# Patient Record
Sex: Female | Born: 1970 | Race: Black or African American | Hispanic: No | Marital: Single | Smoking: Never smoker
Health system: Southern US, Community
[De-identification: ages and names within clinical notes are randomized; demographics above are authoritative.]

## PROBLEM LIST (undated history)

## (undated) DIAGNOSIS — M2550 Pain in unspecified joint: Secondary | ICD-10-CM

## (undated) DIAGNOSIS — M549 Dorsalgia, unspecified: Secondary | ICD-10-CM

## (undated) DIAGNOSIS — K219 Gastro-esophageal reflux disease without esophagitis: Secondary | ICD-10-CM

## (undated) DIAGNOSIS — R002 Palpitations: Secondary | ICD-10-CM

## (undated) DIAGNOSIS — J45909 Unspecified asthma, uncomplicated: Secondary | ICD-10-CM

## (undated) DIAGNOSIS — R7303 Prediabetes: Secondary | ICD-10-CM

## (undated) DIAGNOSIS — D649 Anemia, unspecified: Secondary | ICD-10-CM

## (undated) DIAGNOSIS — Z981 Arthrodesis status: Secondary | ICD-10-CM

## (undated) DIAGNOSIS — F419 Anxiety disorder, unspecified: Secondary | ICD-10-CM

## (undated) DIAGNOSIS — E559 Vitamin D deficiency, unspecified: Secondary | ICD-10-CM

## (undated) HISTORY — DX: Palpitations: R00.2

## (undated) HISTORY — DX: Anxiety disorder, unspecified: F41.9

## (undated) HISTORY — PX: SPINAL FUSION: SHX223

## (undated) HISTORY — DX: Anemia, unspecified: D64.9

## (undated) HISTORY — DX: Pain in unspecified joint: M25.50

## (undated) HISTORY — PX: PARATHYROIDECTOMY: SHX19

## (undated) HISTORY — PX: ABDOMINAL HYSTERECTOMY: SHX81

## (undated) HISTORY — DX: Unspecified asthma, uncomplicated: J45.909

## (undated) HISTORY — DX: Prediabetes: R73.03

## (undated) HISTORY — DX: Vitamin D deficiency, unspecified: E55.9

## (undated) HISTORY — DX: Dorsalgia, unspecified: M54.9

---

## 1998-01-20 ENCOUNTER — Ambulatory Visit (HOSPITAL_COMMUNITY): Admission: RE | Admit: 1998-01-20 | Discharge: 1998-01-20 | Payer: Self-pay | Admitting: Obstetrics and Gynecology

## 1998-05-18 ENCOUNTER — Inpatient Hospital Stay (HOSPITAL_COMMUNITY): Admission: AD | Admit: 1998-05-18 | Discharge: 1998-05-18 | Payer: Self-pay | Admitting: Obstetrics and Gynecology

## 1998-05-29 ENCOUNTER — Inpatient Hospital Stay (HOSPITAL_COMMUNITY): Admission: AD | Admit: 1998-05-29 | Discharge: 1998-05-29 | Payer: Self-pay | Admitting: Obstetrics and Gynecology

## 1998-10-02 ENCOUNTER — Inpatient Hospital Stay (HOSPITAL_COMMUNITY): Admission: AD | Admit: 1998-10-02 | Discharge: 1998-10-02 | Payer: Self-pay | Admitting: Obstetrics and Gynecology

## 1998-11-11 ENCOUNTER — Inpatient Hospital Stay (HOSPITAL_COMMUNITY): Admission: AD | Admit: 1998-11-11 | Discharge: 1998-11-11 | Payer: Self-pay | Admitting: Obstetrics and Gynecology

## 1998-11-28 ENCOUNTER — Inpatient Hospital Stay (HOSPITAL_COMMUNITY): Admission: AD | Admit: 1998-11-28 | Discharge: 1998-11-28 | Payer: Self-pay | Admitting: Obstetrics and Gynecology

## 1999-01-04 ENCOUNTER — Inpatient Hospital Stay (HOSPITAL_COMMUNITY): Admission: AD | Admit: 1999-01-04 | Discharge: 1999-01-06 | Payer: Self-pay | Admitting: *Deleted

## 1999-01-07 ENCOUNTER — Observation Stay (HOSPITAL_COMMUNITY): Admission: AD | Admit: 1999-01-07 | Discharge: 1999-01-08 | Payer: Self-pay | Admitting: Unknown Physician Specialty

## 1999-03-27 ENCOUNTER — Other Ambulatory Visit: Admission: RE | Admit: 1999-03-27 | Discharge: 1999-03-27 | Payer: Self-pay | Admitting: Obstetrics and Gynecology

## 2005-12-18 ENCOUNTER — Emergency Department (HOSPITAL_COMMUNITY): Admission: EM | Admit: 2005-12-18 | Discharge: 2005-12-19 | Payer: Self-pay | Admitting: Emergency Medicine

## 2008-12-30 ENCOUNTER — Inpatient Hospital Stay (HOSPITAL_COMMUNITY): Admission: AD | Admit: 2008-12-30 | Discharge: 2008-12-30 | Payer: Self-pay | Admitting: Obstetrics & Gynecology

## 2008-12-30 ENCOUNTER — Ambulatory Visit: Payer: Self-pay | Admitting: Obstetrics and Gynecology

## 2011-02-06 LAB — URINE MICROSCOPIC-ADD ON

## 2011-02-06 LAB — URINALYSIS, ROUTINE W REFLEX MICROSCOPIC
Glucose, UA: NEGATIVE mg/dL
Protein, ur: NEGATIVE mg/dL
Specific Gravity, Urine: 1.025 (ref 1.005–1.030)
pH: 6.5 (ref 5.0–8.0)

## 2012-03-01 DIAGNOSIS — N83209 Unspecified ovarian cyst, unspecified side: Secondary | ICD-10-CM | POA: Insufficient documentation

## 2013-03-01 DIAGNOSIS — M4120 Other idiopathic scoliosis, site unspecified: Secondary | ICD-10-CM | POA: Insufficient documentation

## 2013-03-01 DIAGNOSIS — M545 Low back pain, unspecified: Secondary | ICD-10-CM | POA: Insufficient documentation

## 2014-06-12 ENCOUNTER — Emergency Department (INDEPENDENT_AMBULATORY_CARE_PROVIDER_SITE_OTHER): Payer: BC Managed Care – PPO

## 2014-06-12 ENCOUNTER — Emergency Department (INDEPENDENT_AMBULATORY_CARE_PROVIDER_SITE_OTHER)
Admission: EM | Admit: 2014-06-12 | Discharge: 2014-06-12 | Disposition: A | Discharge status: Home / Self Care | Payer: BC Managed Care – PPO | Source: Home / Self Care | Attending: Family Medicine | Admitting: Family Medicine

## 2014-06-12 ENCOUNTER — Encounter: Payer: Self-pay | Admitting: Emergency Medicine

## 2014-06-12 DIAGNOSIS — M25519 Pain in unspecified shoulder: Secondary | ICD-10-CM

## 2014-06-12 DIAGNOSIS — M25512 Pain in left shoulder: Secondary | ICD-10-CM

## 2014-06-12 HISTORY — DX: Gastro-esophageal reflux disease without esophagitis: K21.9

## 2014-06-12 HISTORY — DX: Unspecified asthma, uncomplicated: J45.909

## 2014-06-12 MED ORDER — MELOXICAM 15 MG PO TABS: 15.0000 mg | ORAL_TABLET | Freq: Every day | ORAL | Status: DC

## 2014-06-12 NOTE — Discharge Instructions (Signed)
Apply ice pack three to four times daily.  Begin pendulum exercises.   Shoulder Pain The shoulder is the joint that connects your arms to your body. The bones that form the shoulder joint include the upper arm bone (humerus), the shoulder blade (scapula), and the collarbone (clavicle). The top of the humerus is shaped like a ball and fits into a rather flat socket on the scapula (glenoid cavity). A combination of muscles and strong, fibrous tissues that connect muscles to bones (tendons) support your shoulder joint and hold the ball in the socket. Small, fluid-filled sacs (bursae) are located in different areas of the joint. They act as cushions between the bones and the overlying soft tissues and help reduce friction between the gliding tendons and the bone as you move your arm. Your shoulder joint allows a wide range of motion in your arm. This range of motion allows you to do things like scratch your back or throw a ball. However, this range of motion also makes your shoulder more prone to pain from overuse and injury. Causes of shoulder pain can originate from both injury and overuse and usually can be grouped in the following four categories:  Redness, swelling, and pain (inflammation) of the tendon (tendinitis) or the bursae (bursitis).  Instability, such as a dislocation of the joint.  Inflammation of the joint (arthritis).  Broken bone (fracture). HOME CARE INSTRUCTIONS   Apply ice to the sore area.  Put ice in a plastic bag.  Place a towel between your skin and the bag.  Leave the ice on for 15-20 minutes, 3-4 times per day for the first 2 days, or as directed by your health care provider.  Stop using cold packs if they do not help with the pain.  If you have a shoulder sling or immobilizer, wear it as long as your caregiver instructs. Only remove it to shower or bathe. Move your arm as little as possible, but keep your hand moving to prevent swelling.  Squeeze a soft ball or  foam pad as much as possible to help prevent swelling.  Only take over-the-counter or prescription medicines for pain, discomfort, or fever as directed by your caregiver. SEEK MEDICAL CARE IF:   Your shoulder pain increases, or new pain develops in your arm, hand, or fingers.  Your hand or fingers become cold and numb.  Your pain is not relieved with medicines. SEEK IMMEDIATE MEDICAL CARE IF:   Your arm, hand, or fingers are numb or tingling.  Your arm, hand, or fingers are significantly swollen or turn white or blue. MAKE SURE YOU:   Understand these instructions.  Will watch your condition.  Will get help right away if you are not doing well or get worse. Document Released: 07/23/2005 Document Revised: 02/27/2014 Document Reviewed: 09/27/2011 [facility] Patient Information 2015 [place], [place]. This information is not intended to replace advice given to you by your health care provider. Make sure you discuss any questions you have with your health care provider.

## 2014-06-12 NOTE — ED Provider Notes (Signed)
CSN: [id number]     Arrival date & time 06/12/14  1701 History   First MD Initiated Contact with Patient 06/12/14 1735     Chief Complaint  Patient presents with  . Shoulder Pain      HPI Comments: Patient complains of two month history of intermittent ache in left shoulder that has now become painful and constant over the past 3 weeks.  The pain is worse at night and awakens her.  She has had poor response to ibuprofen and ice packs.  She states that she is unable to abduct her left arm above shoulder level, and the pain radiates to her left upper arm.  She denies trauma or injury to her left shoulder. However, from January 16, 2014 to Mar 08, 2014, she participated in a weekly cross-fit course.  The course included many repetitive exercises that involved her upper extremities, but she does not recall any problems during her workouts.  Patient is a 43 y.o. female presenting with shoulder pain. The history is provided by the patient.  Shoulder Pain This is a new problem. Episode onset: 2 months ago. The problem occurs constantly. The problem has been gradually worsening. Pertinent negatives include no chest pain. Exacerbated by: any movement of left shoulder. Nothing relieves the symptoms. Treatments tried: ice packs and ibuprofen. The treatment provided no relief.    Past Medical History  Diagnosis Date  . Reactive airway disease   . GERD (gastroesophageal reflux disease)    Past Surgical History  Procedure Laterality Date  . Parathyroidectomy    . Spinal fusion    . Abdominal hysterectomy     Family History  Problem Relation Age of Onset  . Thyroid disease Mother    History  Substance Use Topics  . Smoking status: Never Smoker   . Smokeless tobacco: Not on file  . Alcohol Use: No   OB History   Grav Para Term Preterm Abortions TAB SAB Ect Mult Living                 Review of Systems  Cardiovascular: Negative for chest pain.  All other systems reviewed and are  negative.   Allergies  Review of patient's allergies indicates no known allergies.  Home Medications   Prior to Admission medications   Medication Sig Start Date End Date Taking? Authorizing Provider  albuterol (PROVENTIL HFA;VENTOLIN HFA) 108 (90 BASE) MCG/ACT inhaler Inhale into the lungs every 6 (six) hours as needed for wheezing or shortness of breath.   Yes Historical Provider, MD  polyethylene glycol (MIRALAX / GLYCOLAX) packet Take 17 g by mouth daily.   Yes Historical Provider, MD  ranitidine (ZANTAC) 150 MG capsule Take 150 mg by mouth 2 (two) times daily.   Yes Historical Provider, MD  meloxicam (MOBIC) 15 MG tablet Take 1 tablet (15 mg total) by mouth daily. Take with food each morning 06/12/14   Lattie Haw, MD   BP 148/95  Pulse 69  Temp(Src) 98.4 F (36.9 C) (Oral)  Resp 18  Ht 5\' 5"  (1.651 m)  Wt 181 lb (82.101 kg)  BMI 30.12 kg/m2  SpO2 99% Physical Exam  Nursing note and vitals reviewed. Constitutional: She is oriented to person, place, and time. She appears well-developed and well-nourished. No distress.  Patient is obese (BMI 30.1)  HENT:  Head: Normocephalic.  Mouth/Throat: Oropharynx is clear and moist.  Eyes: Conjunctivae and EOM are normal. Pupils are equal, round, and reactive to light.  Neck: Normal range of  motion. Neck supple.  Cardiovascular: Normal heart sounds.   Pulmonary/Chest: Breath sounds normal.  Musculoskeletal:       Right shoulder: She exhibits decreased range of motion, pain and decreased strength. She exhibits no bony tenderness, no swelling, no effusion, no crepitus, no deformity and normal pulse.       Left shoulder: She exhibits decreased range of motion, tenderness, pain and decreased strength. She exhibits no bony tenderness, no swelling, no effusion, no crepitus, no deformity, no spasm and normal pulse.       Arms: Left shoulder has decreased range of motion:  Cannot actively abduct more than about 80 degrees from vertical, and  cannot passively abduct more than 10 degrees above horizontal.   There is tenderness to palpation over the long head of left biceps tendon.   Patient performs Apley's test reasonably well.  Good internal/external range of motion but decreased strength to external rotation.  No tenderness to palpation over shoulder and left biceps/triceps areas. Unable to perform empty can test.  Hawkin's test positive.  Areas of pain (but not tenderness to palpation) outlined in blue  Lymphadenopathy:    She has no cervical adenopathy.  Neurological: She is alert and oriented to person, place, and time.  Skin: Skin is warm and dry. No rash noted.    ED Course  Procedures  none     Imaging Review Dg Shoulder Left  06/12/2014   CLINICAL DATA:  Shoulder and arm pain.  EXAM: LEFT SHOULDER - 2+ VIEW; LEFT HUMERUS - 2+ VIEW  COMPARISON:  None.  FINDINGS: Left shoulder:  The joint spaces are maintained. No acute bony findings. No abnormal soft tissue calcifications. The visualized left lung is clear.  Left humerus:  The shoulder and elbow joints are maintained. No acute bony findings.  IMPRESSION: No acute bony findings.   Electronically Signed   By: Loralie ChampagneMark  Gallerani M.D.   On: 06/12/2014 18:24   Dg Humerus Left  06/12/2014   CLINICAL DATA:  Shoulder and arm pain.  EXAM: LEFT SHOULDER - 2+ VIEW; LEFT HUMERUS - 2+ VIEW  COMPARISON:  None.  FINDINGS: Left shoulder:  The joint spaces are maintained. No acute bony findings. No abnormal soft tissue calcifications. The visualized left lung is clear.  Left humerus:  The shoulder and elbow joints are maintained. No acute bony findings.  IMPRESSION: No acute bony findings.   Electronically Signed   By: Loralie ChampagneMark  Gallerani M.D.   On: 06/12/2014 18:24     MDM   1. Shoulder pain, left; suspect rotator cuff tendonitis    Begin Mobic. Apply ice pack three to four times daily.  Begin pendulum exercises. Followup with Dr. Rodney Langtonhomas Thekkekandam ([facility]) as soon as  possible.     Lattie HawStephen A Auther Lyerly, MD 06/12/14 2031

## 2014-06-12 NOTE — ED Notes (Signed)
Pt c/o LT shoulder pain x 6-8 wks, worse x 3 wks. Denies injury. She has taken IBF with minimal relief.

## 2014-06-13 ENCOUNTER — Telehealth: Payer: Self-pay | Admitting: *Deleted

## 2014-06-15 ENCOUNTER — Ambulatory Visit (INDEPENDENT_AMBULATORY_CARE_PROVIDER_SITE_OTHER): Payer: BC Managed Care – PPO | Admitting: Sports Medicine

## 2014-06-15 ENCOUNTER — Encounter: Payer: Self-pay | Admitting: Sports Medicine

## 2014-06-15 VITALS — BP 130/80 | HR 83 | Ht 65.0 in | Wt 181.0 lb

## 2014-06-15 DIAGNOSIS — M25819 Other specified joint disorders, unspecified shoulder: Secondary | ICD-10-CM

## 2014-06-15 DIAGNOSIS — M7542 Impingement syndrome of left shoulder: Secondary | ICD-10-CM | POA: Insufficient documentation

## 2014-06-15 DIAGNOSIS — M7580 Other shoulder lesions, unspecified shoulder: Secondary | ICD-10-CM

## 2014-06-15 NOTE — Assessment & Plan Note (Signed)
Pain predominantly over the deltoid with overhead activities. Subacromial injection as above, formal PT, Mobic. Return to see me in a month.

## 2014-06-15 NOTE — Progress Notes (Signed)
   Subjective:    I'm seeing this patient as a consultation for:  Dr. Cathren HarshBeese  CC: Left shoulder pain  HPI: This is a very pleasant year-old female, she comes in with several month history of pain and localizes of the deltoid, worse with overhead activities, and seemingly precipitated by a Cross-Fit class. This class involved a significant amount of overhead activity. Pain is moderate, persistent with radiation of the deltoid. She has tried NSAIDs without any improvement, was seen in urgent care and referred to me for further evaluation and definitive treatment.  Past medical history, Surgical history, Family history not pertinant except as noted below, Social history, Allergies, and medications have been entered into the medical record, reviewed, and no changes needed.   Review of Systems: No headache, visual changes, nausea, vomiting, diarrhea, constipation, dizziness, abdominal pain, skin rash, fevers, chills, night sweats, weight loss, swollen lymph nodes, body aches, joint swelling, muscle aches, chest pain, shortness of breath, mood changes, visual or auditory hallucinations.   Objective:   General: Well Developed, well nourished, and in no acute distress.  Neuro/Psych: Alert and oriented x3, extra-ocular muscles intact, able to move all 4 extremities, sensation grossly intact. Skin: Warm and dry, no rashes noted.  Respiratory: Not using accessory muscles, speaking in full sentences, trachea midline.  Cardiovascular: Pulses palpable, no extremity edema. Abdomen: Does not appear distended. Left Shoulder: Inspection reveals no abnormalities, atrophy or asymmetry. Palpation is normal with no tenderness over AC joint or bicipital groove. ROM is full in all planes. Rotator cuff strength normal throughout. Positive Neer and Hawkin's tests, empty can. Speeds and Yergason's tests normal. No labral pathology noted with negative Obrien's, negative crank, negative clunk, and good  stability. Normal scapular function observed. No painful arc and no drop arm sign. No apprehension sign  Procedure: Real-time Ultrasound Guided Injection of left subacromial bursa Device: GE Logiq E  Verbal informed consent obtained.  Time-out conducted.  Noted no overlying erythema, induration, or other signs of local infection.  Skin prepped in a sterile fashion.  Local anesthesia: Topical Ethyl chloride.  With sterile technique and under real time ultrasound guidance:  1 cc Kenalog 40 and 3 cc lidocaine injected easily. Completed without difficulty  Pain immediately resolved suggesting accurate placement of the medication.  Advised to call if fevers/chills, erythema, induration, drainage, or persistent bleeding.  Images permanently stored and available for review in the ultrasound unit.  Impression: Technically successful ultrasound guided injection.  Impression and Recommendations:   This case required medical decision making of moderate complexity.

## 2014-06-26 ENCOUNTER — Ambulatory Visit: Payer: BC Managed Care – PPO | Admitting: Physical Therapy

## 2014-07-13 ENCOUNTER — Ambulatory Visit: Payer: BC Managed Care – PPO | Admitting: Sports Medicine

## 2014-07-13 DIAGNOSIS — Z0289 Encounter for other administrative examinations: Secondary | ICD-10-CM

## 2015-04-16 ENCOUNTER — Emergency Department (INDEPENDENT_AMBULATORY_CARE_PROVIDER_SITE_OTHER): Payer: BLUE CROSS/BLUE SHIELD

## 2015-04-16 ENCOUNTER — Encounter: Payer: Self-pay | Admitting: *Deleted

## 2015-04-16 ENCOUNTER — Emergency Department
Admission: EM | Admit: 2015-04-16 | Discharge: 2015-04-16 | Disposition: A | Discharge status: Home / Self Care | Payer: BLUE CROSS/BLUE SHIELD | Source: Home / Self Care | Attending: Family Medicine | Admitting: Family Medicine

## 2015-04-16 DIAGNOSIS — M79671 Pain in right foot: Secondary | ICD-10-CM | POA: Diagnosis not present

## 2015-04-16 DIAGNOSIS — S93601A Unspecified sprain of right foot, initial encounter: Secondary | ICD-10-CM | POA: Diagnosis not present

## 2015-04-16 MED ORDER — MELOXICAM 15 MG PO TABS: 15.0000 mg | ORAL_TABLET | Freq: Every day | ORAL | Status: DC

## 2015-04-16 NOTE — ED Notes (Signed)
Pt c/o RT foot injury x 2 wks ago. She reports twisting while walking in flip flops.

## 2015-04-16 NOTE — ED Provider Notes (Signed)
CSN: [id number]     Arrival date & time 04/16/15  1532 History   First MD Initiated Contact with Patient 04/16/15 1659     Chief Complaint  Patient presents with  . Foot Injury      HPI Comments: Patient states that she twisted (inverted) her right foot two weeks ago while wearing "flip flops."  She has had persistent pain with walking.  She also has pain at night.  Patient is a 44 y.o. female presenting with foot injury. The history is provided by the patient.  Foot Injury Time since incident:  2 weeks Injury: yes   Mechanism of injury comment:  Inverted foot Pain details:    Quality:  Aching   Radiates to:  Does not radiate   Severity:  Moderate   Onset quality:  Sudden   Duration:  2 weeks   Timing:  Constant   Progression:  Unchanged Chronicity:  New Dislocation: no   Prior injury to area:  No Worsened by:  Bearing weight Ineffective treatments:  None tried Associated symptoms: stiffness and swelling   Associated symptoms: no back pain, no decreased ROM, no fatigue, no muscle weakness, no numbness and no tingling   Risk factors: obesity     Past Medical History  Diagnosis Date  . Reactive airway disease   . GERD (gastroesophageal reflux disease)    Past Surgical History  Procedure Laterality Date  . Parathyroidectomy    . Spinal fusion    . Abdominal hysterectomy     Family History  Problem Relation Age of Onset  . Thyroid disease Mother   . Non-Hodgkin's lymphoma Father   . Hypertension Brother    History  Substance Use Topics  . Smoking status: Never Smoker   . Smokeless tobacco: Not on file  . Alcohol Use: Yes     Comment: socially   OB History    No data available     Review of Systems  Constitutional: Negative for fatigue.  Musculoskeletal: Positive for stiffness. Negative for back pain.  All other systems reviewed and are negative.   Allergies  Review of patient's allergies indicates no known allergies.  Home Medications   Prior to  Admission medications   Medication Sig Start Date End Date Taking? Authorizing Provider  albuterol (PROVENTIL HFA;VENTOLIN HFA) 108 (90 BASE) MCG/ACT inhaler Inhale into the lungs every 6 (six) hours as needed for wheezing or shortness of breath.    Historical Provider, MD  meloxicam (MOBIC) 15 MG tablet Take 1 tablet (15 mg total) by mouth daily. Take with food each morning 04/16/15   Lattie Haw, MD  polyethylene glycol (MIRALAX / GLYCOLAX) packet Take 17 g by mouth daily.    Historical Provider, MD  ranitidine (ZANTAC) 150 MG capsule Take 150 mg by mouth 2 (two) times daily.    Historical Provider, MD   BP 137/80 mmHg  Pulse 69  Temp(Src) 98.2 F (36.8 C) (Oral)  Resp 16  Ht 5\' 5"  (1.651 m)  Wt 184 lb (83.462 kg)  BMI 30.62 kg/m2  SpO2 99% Physical Exam  Constitutional: She appears well-developed and well-nourished. No distress.  Patient is obese (BMI 30.6)  HENT:  Head: Normocephalic.  Musculoskeletal:       Right foot: There is tenderness and bony tenderness. There is normal range of motion, no swelling, normal capillary refill, no crepitus, no deformity and no laceration.       Feet:  Right foot has tenderness to palpation over the dorsal distal  4th and 5th metatarsals as noted on diagram.  No swelling or ecchymosis.  Toes have good range of motion.  Distal neurovascular function is intact.     Neurological: She is alert.  Skin: Skin is warm and dry.  Nursing note and vitals reviewed.   ED Course  Procedures   none Imaging Review Dg Foot Complete Right  04/16/2015   CLINICAL DATA:  Twisting injury 2 weeks ago with persistent pain laterally  EXAM: RIGHT FOOT COMPLETE - 3+ VIEW  COMPARISON:  None.  FINDINGS: There is no evidence of fracture or dislocation. There is no evidence of arthropathy or other focal bone abnormality. Soft tissues are unremarkable.  IMPRESSION: No acute abnormality noted.   Electronically Signed   By: Alcide Clever M.D.   On: 04/16/2015 16:11      MDM   1. Right foot sprain, initial encounter    Cam Walker applied.  Begin Mobic  daily. Wear brace for about 2 weeks.  Begin range of motion and stretching exercises as tolerated. Followup with Dr. Rodney Langton ([facility]) if not improving about two weeks.     Lattie Haw, MD 04/19/15 1100

## 2015-04-16 NOTE — Discharge Instructions (Signed)
Wear brace for about 2 weeks.  Begin range of motion and stretching exercises as tolerated.   Foot Sprain The muscles and cord like structures which attach muscle to bone (tendons) that surround the feet are made up of units. A foot sprain can occur at the weakest spot in any of these units. This condition is most often caused by injury to or overuse of the foot, as from playing contact sports, or aggravating a previous injury, or from poor conditioning, or obesity. SYMPTOMS  Pain with movement of the foot.  Tenderness and swelling at the injury site.  Loss of strength is present in moderate or severe sprains. THE THREE GRADES OR SEVERITY OF FOOT SPRAIN ARE:  Mild (Grade I): Slightly pulled muscle without tearing of muscle or tendon fibers or loss of strength.  Moderate (Grade II): Tearing of fibers in a muscle, tendon, or at the attachment to bone, with small decrease in strength.  Severe (Grade III): Rupture of the muscle-tendon-bone attachment, with separation of fibers. Severe sprain requires surgical repair. Often repeating (chronic) sprains are caused by overuse. Sudden (acute) sprains are caused by direct injury or over-use. DIAGNOSIS  Diagnosis of this condition is usually by your own observation. If problems continue, a caregiver may be required for further evaluation and treatment. X-rays may be required to make sure there are not breaks in the bones (fractures) present. Continued problems may require physical therapy for treatment. PREVENTION  Use strength and conditioning exercises appropriate for your sport.  Warm up properly prior to working out.  Use athletic shoes that are made for the sport you are participating in.  Allow adequate time for healing. Early return to activities makes repeat injury more likely, and can lead to an unstable arthritic foot that can result in prolonged disability. Mild sprains generally heal in 3 to 10 days, with moderate and severe sprains  taking 2 to 10 weeks. Your caregiver can help you determine the proper time required for healing. HOME CARE INSTRUCTIONS   Apply ice to the injury for 15-20 minutes, 03-04 times per day. Put the ice in a plastic bag and place a towel between the bag of ice and your skin.  An elastic wrap (like an Ace bandage) may be used to keep swelling down.  Keep foot above the level of the heart, or at least raised on a footstool, when swelling and pain are present.  Try to avoid use other than gentle range of motion while the foot is painful. Do not resume use until instructed by your caregiver. Then begin use gradually, not increasing use to the point of pain. If pain does develop, decrease use and continue the above measures, gradually increasing activities that do not cause discomfort, until you gradually achieve normal use.  Use crutches if and as instructed, and for the length of time instructed.  Keep injured foot and ankle wrapped between treatments.  Massage foot and ankle for comfort and to keep swelling down. Massage from the toes up towards the knee.  Only take over-the-counter or prescription medicines for pain, discomfort, or fever as directed by your caregiver. SEEK IMMEDIATE MEDICAL CARE IF:   Your pain and swelling increase, or pain is not controlled with medications.  You have loss of feeling in your foot or your foot turns cold or blue.  You develop new, unexplained symptoms, or an increase of the symptoms that brought you to your caregiver. MAKE SURE YOU:   Understand these instructions.  Will watch your  condition.  Will get help right away if you are not doing well or get worse. Document Released: 04/04/2002 Document Revised: 01/05/2012 Document Reviewed: 06/01/2008 [facility] Patient Information 2015 [place], [place]. This information is not intended to replace advice given to you by your health care provider. Make sure you discuss any questions you have with your health care  provider.

## 2016-12-02 ENCOUNTER — Emergency Department
Admission: EM | Admit: 2016-12-02 | Discharge: 2016-12-02 | Disposition: A | Discharge status: Home / Self Care | Payer: BLUE CROSS/BLUE SHIELD | Source: Home / Self Care | Attending: Family Medicine | Admitting: Family Medicine

## 2016-12-02 ENCOUNTER — Encounter: Payer: Self-pay | Admitting: *Deleted

## 2016-12-02 DIAGNOSIS — M549 Dorsalgia, unspecified: Secondary | ICD-10-CM | POA: Diagnosis not present

## 2016-12-02 DIAGNOSIS — M62830 Muscle spasm of back: Secondary | ICD-10-CM

## 2016-12-02 MED ORDER — CYCLOBENZAPRINE HCL 5 MG PO TABS: 5.0000 mg | ORAL_TABLET | Freq: Three times a day (TID) | ORAL | 0 refills | Status: DC | PRN

## 2016-12-02 MED ORDER — PREDNISONE 20 MG PO TABS: ORAL_TABLET | ORAL | 0 refills | Status: DC

## 2016-12-02 MED ORDER — MELOXICAM 7.5 MG PO TABS: 7.5000 mg | ORAL_TABLET | Freq: Every day | ORAL | 0 refills | Status: DC

## 2016-12-02 NOTE — Discharge Instructions (Signed)
°  Flexeril is a muscle relaxer and may cause drowsiness. Do not drink alcohol, drive, or operate heavy machinery while taking. ° °Meloxicam (Mobic) is an antiinflammatory to help with pain and inflammation.  Do not take ibuprofen, Advil, Aleve, or any other medications that contain NSAIDs while taking meloxicam as this may cause stomach upset or even ulcers if taken in large amounts for an extended period of time.  ° °

## 2016-12-02 NOTE — ED Provider Notes (Signed)
CSN: [id number]     Arrival date & time 12/02/16  1251 History   First MD Initiated Contact with Patient 12/02/16 1321     Chief Complaint  Patient presents with  . Back Pain   (Consider location/radiation/quality/duration/timing/severity/associated sxs/prior Treatment) HPI  Kim Cannon is a 46 y.o. female presenting to UC with c/o persistent waxing and waning Right upper back pain with a knot in her back felt by her boyfriend yesterday.  Pain started 3-4 days ago after waking up.  Now she is having intermittent numbness/tingling in Right arm.  Hx of scoliosis and several spinal surgeries but last surgery was several years ago. Pt notes she sits at a desk most of the day. Denies heavy lifting, falls or known injury.  Pain is 6/10 at worst.    Past Medical History:  Diagnosis Date  . GERD (gastroesophageal reflux disease)   . Reactive airway disease    Past Surgical History:  Procedure Laterality Date  . ABDOMINAL HYSTERECTOMY    . PARATHYROIDECTOMY    . SPINAL FUSION     Family History  Problem Relation Age of Onset  . Thyroid disease Mother   . Non-Hodgkin's lymphoma Father   . Hypertension Brother    Social History  Substance Use Topics  . Smoking status: Never Smoker  . Smokeless tobacco: Never Used  . Alcohol use Yes     Comment: socially   OB History    No data available     Review of Systems  Musculoskeletal: Positive for arthralgias, back pain and myalgias. Negative for neck pain and neck stiffness.  Neurological: Positive for numbness. Negative for weakness.    Allergies  Patient has no known allergies.  Home Medications   Prior to Admission medications   Medication Sig Start Date End Date Taking? Authorizing Provider  cyclobenzaprine (FLEXERIL) 5 MG tablet Take 1-2 tablets (5-10 mg total) by mouth 3 (three) times daily as needed for muscle spasms. 12/02/16   Junius FinnerErin O'Malley, PA-C  meloxicam (MOBIC) 7.5 MG tablet Take 1 tablet (7.5 mg total) by mouth daily.  12/02/16   Junius FinnerErin O'Malley, PA-C  predniSONE (DELTASONE) 20 MG tablet 3 tabs po day one, then 2 po daily x 4 days 12/02/16   Junius FinnerErin O'Malley, PA-C   Meds Ordered and Administered this Visit  Medications - No data to display  BP 133/85 (BP Location: Left Arm)   Pulse 73   Wt 187 lb (84.8 kg)   SpO2 98%   BMI 31.12 kg/m  No data found.   Physical Exam  Constitutional: She is oriented to person, place, and time. She appears well-developed and well-nourished. No distress.  HENT:  Head: Normocephalic and atraumatic.  Eyes: EOM are normal.  Neck: Normal range of motion.  Cardiovascular: Normal rate.   Pulses:      Radial pulses are 2+ on the right side, and 2+ on the left side.  Pulmonary/Chest: Effort normal.  Musculoskeletal: Normal range of motion. She exhibits tenderness. She exhibits no edema.  No midline spinal tenderness. Tenderness to Right upper trapezius and rhomboid muscles. Palpable muscle spasm. Full ROM upper and lower extremities with 5/5 strength bilaterally.    Neurological: She is alert and oriented to person, place, and time.  Skin: Skin is warm and dry. She is not diaphoretic.  Psychiatric: She has a normal mood and affect. Her behavior is normal.  Nursing note and vitals reviewed.   Urgent Care Course     Procedures (including critical care time)  Labs Review Labs Reviewed - No data to display  Imaging Review No results found.    MDM   1. Upper back pain on right side   2. Muscle spasm of back    Hx and exam c/w pain numbness/tingling from back muscle spasm No bony tenderness. No indication for imaging at this time. Discussed conservative treatment. Pt agreeable. Rx: Meloxicam, flexeril, and prednisone  Home care instructions provided. F/u with Sports Medicine in 1 week if not improving.     Junius Finner, PA-C 12/02/16 [phone]

## 2016-12-02 NOTE — ED Triage Notes (Signed)
Patient awoke with pain in center of her upper back 3-4 days ago without known injury or extra activity. Pain is progressive and radiating down her right arm, numbness/tingling at times.

## 2018-01-15 ENCOUNTER — Ambulatory Visit
Admission: EM | Admit: 2018-01-15 | Discharge: 2018-01-15 | Disposition: A | Discharge status: Home / Self Care | Payer: BC Managed Care – PPO | Attending: Family Medicine | Admitting: Family Medicine

## 2018-01-15 ENCOUNTER — Ambulatory Visit (INDEPENDENT_AMBULATORY_CARE_PROVIDER_SITE_OTHER): Payer: BC Managed Care – PPO

## 2018-01-15 DIAGNOSIS — S93401A Sprain of unspecified ligament of right ankle, initial encounter: Secondary | ICD-10-CM | POA: Diagnosis not present

## 2018-01-15 DIAGNOSIS — M25571 Pain in right ankle and joints of right foot: Secondary | ICD-10-CM

## 2018-01-15 MED ORDER — HYDROCODONE-ACETAMINOPHEN 5-325 MG PO TABS: ORAL_TABLET | ORAL | 0 refills | Status: DC

## 2018-01-15 NOTE — ED Provider Notes (Signed)
[facility]    CSN: [id number] Arrival date & time: 01/15/18  1910     History   Chief Complaint Chief Complaint  Patient presents with  . Ankle Pain    HPI Kim Cannon is a 47 y.o. female.   The history is provided by the patient.  Ankle Pain  Location:  Ankle Time since incident:  2 days Injury: yes   Mechanism of injury: fall   Fall:    Fall occurred:  Walking (twisting ankle and fell while walking in high heels)   Impact surface:  Hard floor   Entrapped after fall: no   Ankle location:  R ankle Pain details:    Quality:  Aching Chronicity:  New Dislocation: no   Prior injury to area:  No Relieved by:  Ice, NSAIDs and acetaminophen Worsened by:  Bearing weight and activity Associated symptoms: swelling   Associated symptoms: no back pain, no decreased ROM, no fatigue, no fever, no itching, no muscle weakness, no neck pain, no numbness, no stiffness and no tingling     Past Medical History:  Diagnosis Date  . GERD (gastroesophageal reflux disease)   . Reactive airway disease     Patient Active Problem List   Diagnosis Date Noted  . Impingement syndrome of left shoulder 06/15/2014    Past Surgical History:  Procedure Laterality Date  . ABDOMINAL HYSTERECTOMY    . PARATHYROIDECTOMY    . SPINAL FUSION      OB History   None      Home Medications    Prior to Admission medications   Medication Sig Start Date End Date Taking? Authorizing Provider  cyclobenzaprine (FLEXERIL) 5 MG tablet Take 1-2 tablets (5-10 mg total) by mouth 3 (three) times daily as needed for muscle spasms. 12/02/16   Lurene Shadow, PA-C  HYDROcodone-acetaminophen (NORCO/VICODIN) 5-325 MG tablet 1-2 tabs po bid prn 01/15/18   Payton Mccallum, MD  meloxicam (MOBIC) 7.5 MG tablet Take 1 tablet (7.5 mg total) by mouth daily. 12/02/16   Lurene Shadow, PA-C  predniSONE (DELTASONE) 20 MG tablet 3 tabs po day one, then 2 po daily x 4 days 12/02/16   Lurene Shadow, PA-C     Family History Family History  Problem Relation Age of Onset  . Thyroid disease Mother   . Non-Hodgkin's lymphoma Father   . Hypertension Brother     Social History Social History   Tobacco Use  . Smoking status: Never Smoker  . Smokeless tobacco: Never Used  Substance Use Topics  . Alcohol use: Yes    Comment: socially  . Drug use: No     Allergies   Patient has no known allergies.   Review of Systems Review of Systems  Constitutional: Negative for fatigue and fever.  Musculoskeletal: Negative for back pain, neck pain and stiffness.  Skin: Negative for itching.     Physical Exam Triage Vital Signs ED Triage Vitals  Enc Vitals Group     BP 01/15/18 1937 130/87     Pulse Rate 01/15/18 1937 80     Resp 01/15/18 1937 18     Temp 01/15/18 1937 98.4 F (36.9 C)     Temp Source 01/15/18 1937 Oral     SpO2 01/15/18 1937 97 %     Weight --      Height --      Head Circumference --      Peak Flow --      Pain Score  01/15/18 1938 6     Pain Loc --      Pain Edu? --      Excl. in GC? --    No data found.  Updated Vital Signs BP 130/87 (BP Location: Left Arm)   Pulse 80   Temp 98.4 F (36.9 C) (Oral)   Resp 18   SpO2 97%   Visual Acuity Right Eye Distance:   Left Eye Distance:   Bilateral Distance:    Right Eye Near:   Left Eye Near:    Bilateral Near:     Physical Exam  Constitutional: She appears well-developed and well-nourished. No distress.  Musculoskeletal:       Right ankle: She exhibits decreased range of motion and swelling. She exhibits no ecchymosis, no deformity, no laceration and normal pulse. Tenderness. Lateral malleolus and AITFL tenderness found. No medial malleolus, no CF ligament, no posterior TFL, no head of 5th metatarsal and no proximal fibula tenderness found. Achilles tendon normal.  Skin: She is not diaphoretic.  Nursing note and vitals reviewed.    UC Treatments / Results  Labs (all labs ordered are listed, but  only abnormal results are displayed) Labs Reviewed - No data to display  EKG None Radiology Dg Ankle Complete Right  Result Date: 01/15/2018 CLINICAL DATA:  Right lateral ankle pain and swelling following twisting injury 2 nights ago. EXAM: RIGHT ANKLE - COMPLETE 3+ VIEW COMPARISON:  Right foot radiographs 04/16/2015 FINDINGS: The mineralization and alignment are normal. There is no evidence of acute fracture or dislocation. The joint spaces are maintained. Mild lateral soft tissue swelling. Stable small plantar calcaneal spur. IMPRESSION: No acute osseous findings.  Mild lateral soft tissue swelling. Electronically Signed   By: Carey BullocksWilliam  Veazey M.D.   On: 01/15/2018 20:23    Procedures Procedures (including critical care time)  Medications Ordered in UC Medications - No data to display   Initial Impression / Assessment and Plan / UC Course  I have reviewed the triage vital signs and the nursing notes.  Pertinent labs & imaging results that were available during my care of the patient were reviewed by me and considered in my medical decision making (see chart for details).       Final Clinical Impressions(s) / UC Diagnoses   Final diagnoses:  Sprain of right ankle, unspecified ligament, initial encounter    ED Discharge Orders        Ordered    HYDROcodone-acetaminophen (NORCO/VICODIN) 5-325 MG tablet     01/15/18 2009     1. x-ray results and diagnosis reviewed with patient; cast boot 2. rx as per orders above; reviewed possible side effects, interactions, risks and benefits  3. Recommend supportive treatment with rest, ice, elevation  4. Follow-up prn if symptoms worsen or don't improve  Controlled Substance Prescriptions [place] Controlled Substance Registry consulted? Not Applicable   Payton Mccallumonty, Jahmel Flannagan, MD 01/15/18 2109

## 2018-01-15 NOTE — ED Triage Notes (Signed)
Pt said 2 days ago she twisted her right ankle in high heels and now having pain and swelling. Did do ice, ibuprofen and elevation. Hurts constantly radiating to her right calf even without weight bearing activities.

## 2018-02-03 ENCOUNTER — Other Ambulatory Visit: Payer: Self-pay

## 2018-02-03 ENCOUNTER — Emergency Department
Admission: EM | Admit: 2018-02-03 | Discharge: 2018-02-03 | Disposition: A | Discharge status: Home / Self Care | Payer: BC Managed Care – PPO | Source: Home / Self Care

## 2018-02-03 ENCOUNTER — Emergency Department (INDEPENDENT_AMBULATORY_CARE_PROVIDER_SITE_OTHER): Payer: BC Managed Care – PPO

## 2018-02-03 DIAGNOSIS — M48061 Spinal stenosis, lumbar region without neurogenic claudication: Secondary | ICD-10-CM

## 2018-02-03 DIAGNOSIS — M519 Unspecified thoracic, thoracolumbar and lumbosacral intervertebral disc disorder: Secondary | ICD-10-CM

## 2018-02-03 DIAGNOSIS — M4316 Spondylolisthesis, lumbar region: Secondary | ICD-10-CM

## 2018-02-03 DIAGNOSIS — M5442 Lumbago with sciatica, left side: Secondary | ICD-10-CM

## 2018-02-03 DIAGNOSIS — M545 Low back pain: Secondary | ICD-10-CM

## 2018-02-03 DIAGNOSIS — M4120 Other idiopathic scoliosis, site unspecified: Secondary | ICD-10-CM

## 2018-02-03 HISTORY — DX: Arthrodesis status: Z98.1

## 2018-02-03 MED ORDER — PREDNISONE 20 MG PO TABS: ORAL_TABLET | ORAL | 0 refills | Status: DC

## 2018-02-03 MED ORDER — GABAPENTIN 100 MG PO CAPS: ORAL_CAPSULE | ORAL | 0 refills | Status: DC

## 2018-02-03 MED ORDER — KETOROLAC TROMETHAMINE 60 MG/2ML IM SOLN
60.0000 mg | Freq: Once | INTRAMUSCULAR | Status: AC
  Administered 2018-02-03: 60 mg via INTRAMUSCULAR

## 2018-02-03 MED ORDER — DICLOFENAC SODIUM 75 MG PO TBEC: DELAYED_RELEASE_TABLET | ORAL | 0 refills | Status: DC

## 2018-02-03 NOTE — Discharge Instructions (Addendum)
You are to see Dr. Jenean LindauJohn Birkendal in the next few days.  Please call his office to get this appointment.  I think they were supposed to contact you, but I would recommend you also call them.  Take diclofenac 1 twice daily for pain and inflammation.  This is equivalent to taking a full dose of ibuprofen or Aleve.  Take with food.  Take prednisone 20 mg 3 pills daily for 2 days, then 2 daily for 2 days, then 1 daily for 2 days.  Best taken after breakfast  Take gabapentin 100 mg 1 twice daily for 1 day, then 2 twice daily for 2 days, then 3 twice daily reduce back pain sensitivity.  Get rechecked by your primary care or back surgeon at any time if getting worse.  Your surgeon recommends trying to get an MRI before he sees you if we can, so we are attempting to do so.

## 2018-02-03 NOTE — ED Triage Notes (Signed)
Pt c/o excruciating back x 3 days. Went to sit and car and felt a radiating pain in lower back. No relief with tylenol or ibuprofen. Took a muscle relaxer and that did not offer any relief either.

## 2018-02-03 NOTE — ED Provider Notes (Signed)
[facility]    CSN: [id number] Arrival date & time: 02/03/18  1036     History   Chief Complaint Chief Complaint  Patient presents with  . Back Pain    HPI Kim Cannon is a 47 y.o. female.   HPI Patient had some back pain developed 3 days ago.  When she got in her car she got intense low back pain into her left buttock.  It has persisted unabated for the last 3 days.  [place] [facility] [place] with a friend.  She has had several back surgeries in the past, the first when she was 47 years old for scoliosis.  Her last surgery was a cage procedure and multilevel surgery done by Dr.Birkendal [place].  That was 15 years ago.  She has not seen him recently though she has seen in many times in the past.  It has been a long time since she has had imaging.  The pain is very intense, and she cannot be placed on exam table for more examination. Past Medical History:  Diagnosis Date  . GERD (gastroesophageal reflux disease)   . H/O spinal fusion   . Reactive airway disease     Patient Active Problem List   Diagnosis Date Noted  . Impingement syndrome of left shoulder 06/15/2014    Past Surgical History:  Procedure Laterality Date  . ABDOMINAL HYSTERECTOMY    . PARATHYROIDECTOMY    . SPINAL FUSION      OB History   None      Home Medications    Prior to Admission medications   Medication Sig Start Date End Date Taking? Authorizing Provider  cyclobenzaprine (FLEXERIL) 5 MG tablet Take 1-2 tablets (5-10 mg total) by mouth 3 (three) times daily as needed for muscle spasms. 12/02/16   Lurene ShadowPhelps, Erin O, PA-C  HYDROcodone-acetaminophen (NORCO/VICODIN) 5-325 MG tablet 1-2 tabs po bid prn 01/15/18   Payton Mccallumonty, [place], MD  meloxicam (MOBIC) 7.5 MG tablet Take 1 tablet (7.5 mg total) by mouth daily. 12/02/16   Lurene ShadowPhelps, Erin O, PA-C  predniSONE (DELTASONE) 20 MG tablet 3 tabs po day one, then 2 po daily x 4 days 12/02/16    Lurene ShadowPhelps, Erin O, PA-C    Family History Family History  Problem Relation Age of Onset  . Thyroid disease Mother   . Non-Hodgkin's lymphoma Father   . Hypertension Brother     Social History Social History   Tobacco Use  . Smoking status: Never Smoker  . Smokeless tobacco: Never Used  Substance Use Topics  . Alcohol use: Yes    Comment: socially  . Drug use: No     Allergies   Patient has no known allergies.   Review of Systems Review of Systems No known injury.  No other acute problems.  Physical Exam Triage Vital Signs ED Triage Vitals [02/03/18 1119]  Enc Vitals Group     BP (!) 143/86     Pulse Rate 79     Resp 18     Temp 98.8 F (37.1 C)     Temp Source Oral     SpO2 98 %     Weight 190 lb (86.2 kg)     Height 5\' 5"  (1.651 m)     Head Circumference      Peak Flow      Pain Score 10     Pain Loc      Pain  Edu?      Excl. in GC?    No data found.  Updated Vital Signs BP (!) 143/86 (BP Location: Right Arm)   Pulse 79   Temp 98.8 F (37.1 C) (Oral)   Resp 18   Ht 5\' 5"  (1.651 m)   Wt 190 lb (86.2 kg)   SpO2 98%   BMI 31.62 kg/m   Visual Acuity Right Eye Distance:   Left Eye Distance:   Bilateral Distance:    Right Eye Near:   Left Eye Near:    Bilateral Near:     Physical Exam Very painful back, sitting in a odd position on her chair when I examined her the exam room.  She was able to stand with some difficulty.  Is very tender at the lower lumbar spine just below her old surgical incision.  Very tender across into the left buttock.  Not able to get up on exam table.  Walks with a great deal of difficulty and assistance.  UC Treatments / Results  Labs (all labs ordered are listed, but only abnormal results are displayed) Labs Reviewed - No data to display  EKG None Radiology Dg Lumbar Spine 2-3 Views  Result Date: 02/03/2018 CLINICAL DATA:  Lumbago EXAM: LUMBAR SPINE - 2-3 VIEW COMPARISON:  None. FINDINGS: Frontal, lateral, and  spot lumbosacral lateral images were obtained. There are 5 non-rib-bearing lumbar type vertebral bodies. There is dextroscoliosis with rotatory component. The patient is status post posterior screw and plate fixation at L2 and L3 with support hardware intact. There is also postoperative change posteriorly at L1 and L2. There is no fracture. There is slight retrolisthesis of L3 on L4. No other spondylolisthesis is evident. There is mild disc space narrowing at all levels. No erosive change. IMPRESSION: Multilevel postoperative change with support hardware intact. Scoliosis. No acute fracture. Mild spondylolisthesis at L3-4. Disc space narrowing at all levels. No erosive change. Electronically Signed   By: Bretta Bang III M.D.   On: 02/03/2018 11:36    Procedures Procedures (including critical care time)  Medications Ordered in UC Medications  ketorolac (TORADOL) injection 60 mg (60 mg Intramuscular Given 02/03/18 1205)     Initial Impression / Assessment and Plan / UC Course  I have reviewed the triage vital signs and the nursing notes.  Pertinent labs & imaging results that were available during my care of the patient were reviewed by me and considered in my medical decision making (see chart for details).     Lumbar disc disease with painful radiculopathy in patient with history of several prior back surgeries for scoliosis and disc disease.  Reached her surgeon, Dr. Leeanne Rio in [place] who will arrange to see her.  He requests we get an MRI, and we will attempt to get that scheduled.  It would be best if he had that before he sees the patient, but he will try and see her in the next few days.  Final Clinical Impressions(s) / UC Diagnoses   Final diagnoses:  None  ou are to see Dr. Jenean Lindau in the next few days.  Please call his office to get this appointment.  I think they were supposed to contact you, but I would recommend you also call them.  Take diclofenac  1 twice daily for pain and inflammation.  This is equivalent to taking a full dose of ibuprofen or Aleve.  Take with food.  Take prednisone 20 mg 3 pills daily for 2 days, then 2  daily for 2 days, then 1 daily for 2 days.  Best taken after breakfast  Take gabapentin 100 mg 1 twice daily for 1 day, then 2 twice daily for 2 days, then 3 twice daily reduce back pain sensitivity.  Get rechecked by your primary care or back surgeon at any time if getting worse.  Your surgeon recommends trying to get an MRI before he sees you if we can, so we are attempting to do so.   ED Discharge Orders    None       Controlled Substance Prescriptions [place] Controlled Substance Registry consulted? No   Peyton Najjar, MD 02/03/18 1304

## 2018-02-04 ENCOUNTER — Telehealth: Payer: Self-pay

## 2018-02-04 ENCOUNTER — Other Ambulatory Visit: Payer: Self-pay | Admitting: Family Medicine

## 2018-02-05 ENCOUNTER — Other Ambulatory Visit: Payer: Self-pay | Admitting: Family Medicine

## 2018-02-05 ENCOUNTER — Telehealth: Payer: Self-pay

## 2018-02-05 NOTE — Telephone Encounter (Signed)
Spoke to pt. She stated she was feeling somewhat better. That the medication seemed to be helping. She's a little more mobile today. Scheduled MRI is Sunday at 10am. Advised pt to call us should she have any other questions or concerns.

## 2018-02-07 ENCOUNTER — Inpatient Hospital Stay: Admit: 2018-02-07 | Payer: BC Managed Care – PPO

## 2018-03-02 ENCOUNTER — Other Ambulatory Visit: Payer: Self-pay | Admitting: Family Medicine

## 2018-10-28 DIAGNOSIS — G8929 Other chronic pain: Secondary | ICD-10-CM | POA: Insufficient documentation

## 2018-12-09 DIAGNOSIS — M47816 Spondylosis without myelopathy or radiculopathy, lumbar region: Secondary | ICD-10-CM | POA: Insufficient documentation

## 2019-01-05 ENCOUNTER — Encounter (INDEPENDENT_AMBULATORY_CARE_PROVIDER_SITE_OTHER): Payer: Self-pay

## 2019-01-11 ENCOUNTER — Ambulatory Visit (INDEPENDENT_AMBULATORY_CARE_PROVIDER_SITE_OTHER): Payer: Self-pay | Admitting: Family Medicine

## 2019-01-12 ENCOUNTER — Ambulatory Visit (INDEPENDENT_AMBULATORY_CARE_PROVIDER_SITE_OTHER): Payer: Self-pay | Admitting: Family Medicine

## 2019-01-26 ENCOUNTER — Ambulatory Visit (INDEPENDENT_AMBULATORY_CARE_PROVIDER_SITE_OTHER): Payer: Self-pay | Admitting: Family Medicine

## 2020-02-20 IMAGING — CR DG ANKLE COMPLETE 3+V*R*
3 series · 3 of 3 positions shown · non-contrast
Comparison: Right foot radiographs 04/16/2015

CLINICAL DATA: Right lateral ankle pain and swelling following
twisting injury 2 nights ago.

EXAM:
RIGHT ANKLE - COMPLETE 3+ VIEW

[ankle ap]
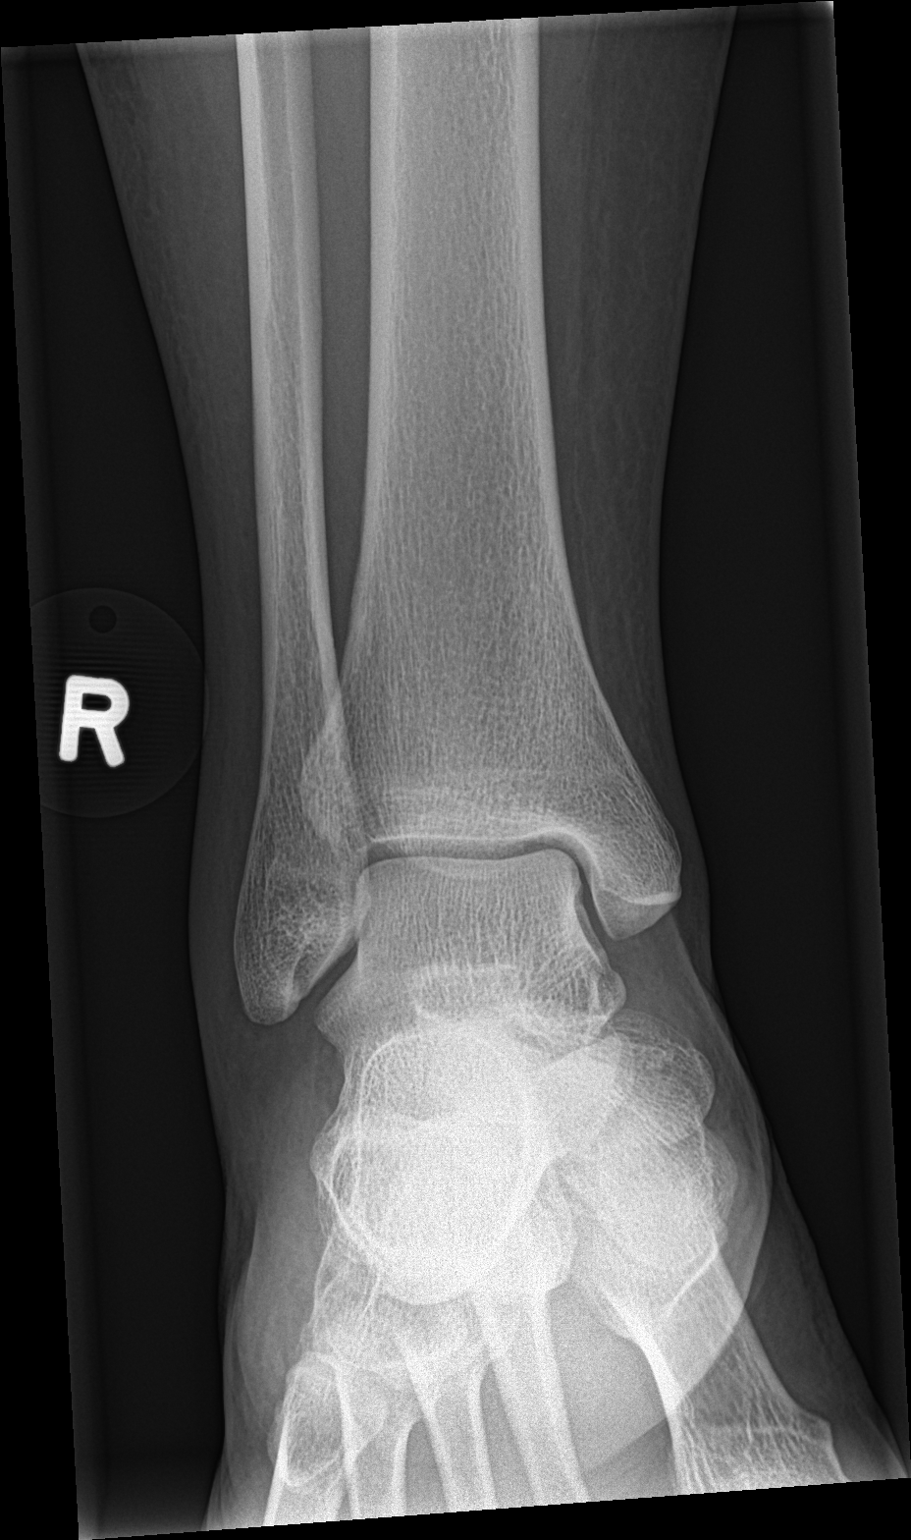

[ankle obl]
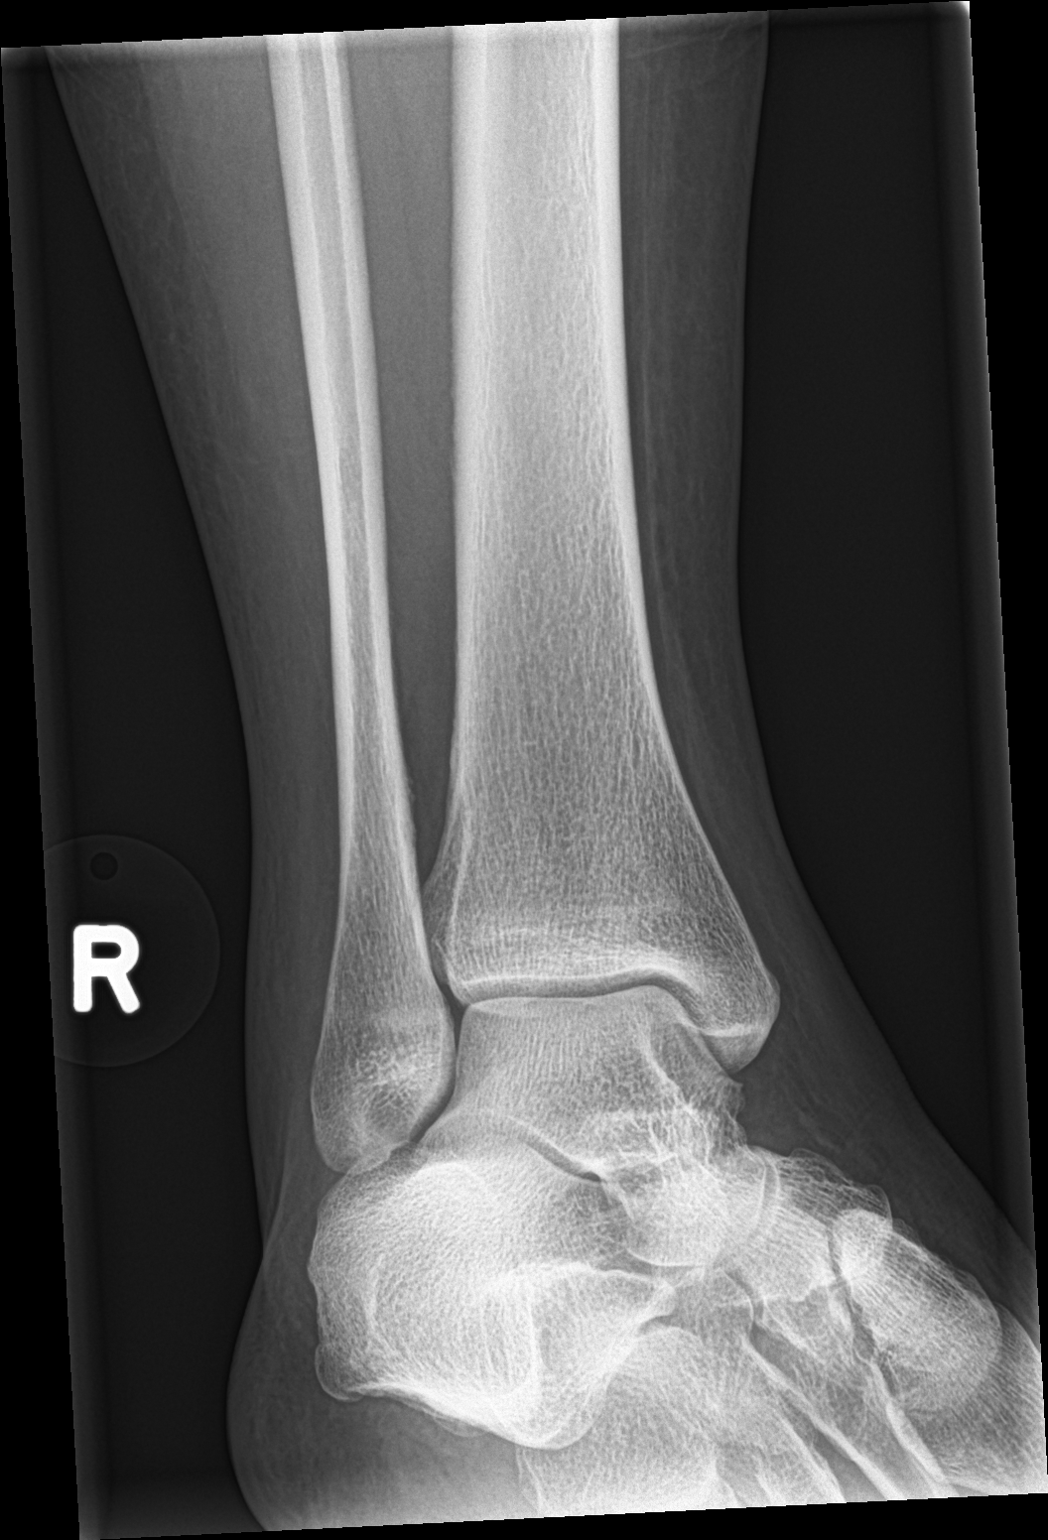

[ankle lat]
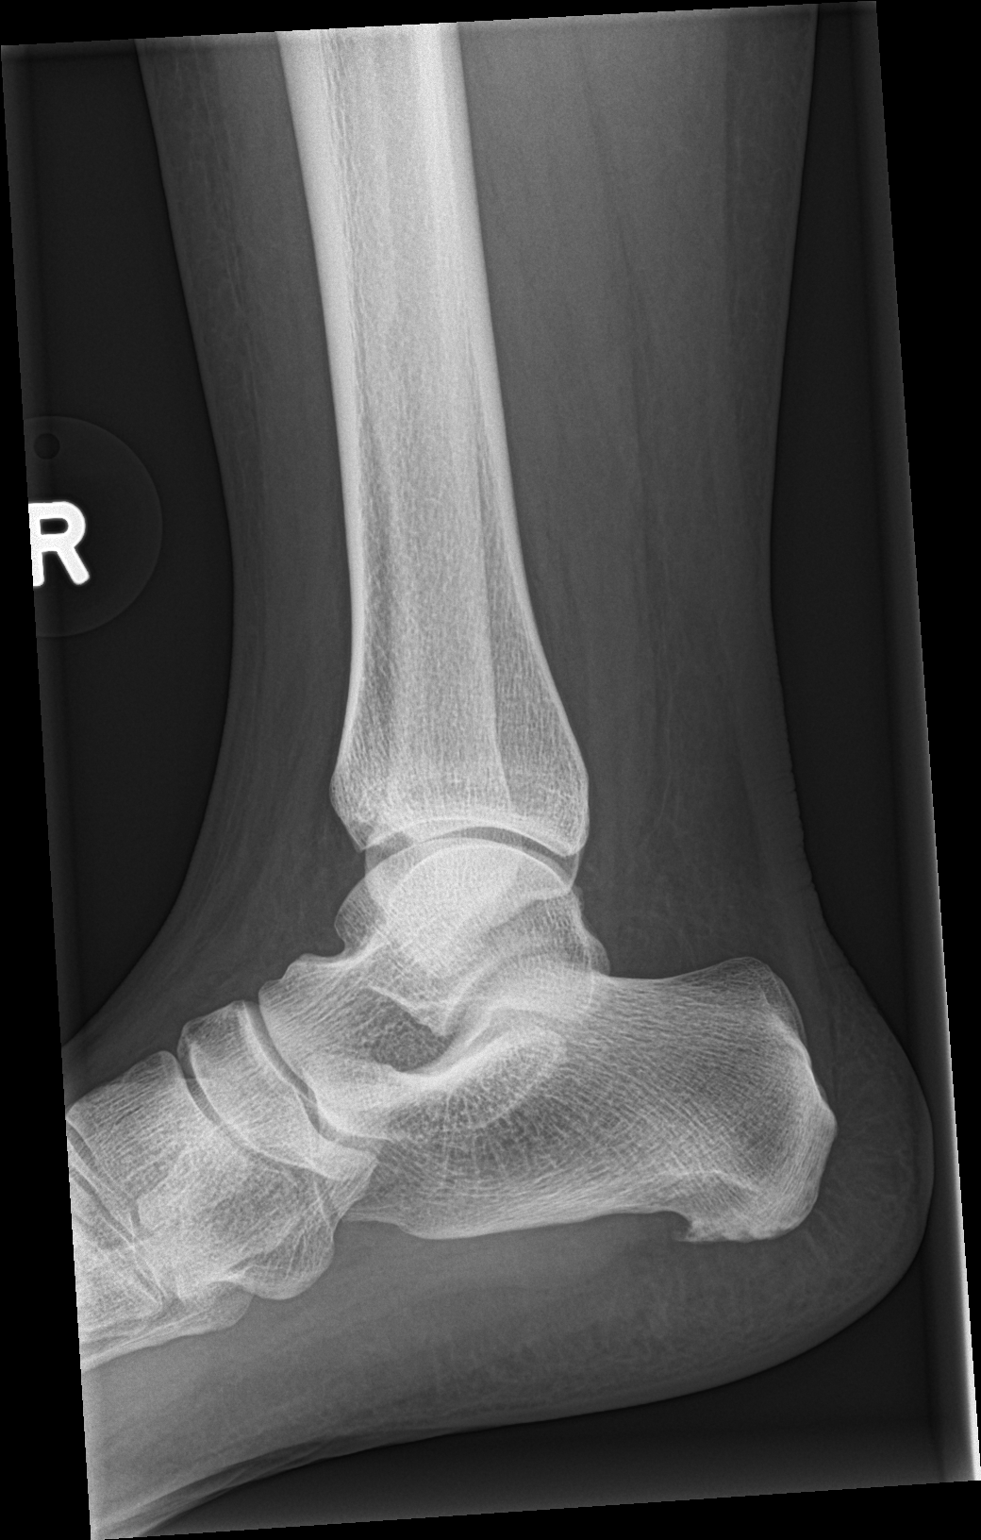

[3 of 3 positions shown; findings below may reference images not displayed]

FINDINGS: The mineralization and alignment are normal. There is no evidence of
acute fracture or dislocation. The joint spaces are maintained. Mild
lateral soft tissue swelling. Stable small plantar calcaneal spur.
IMPRESSION: No acute osseous findings.  Mild lateral soft tissue swelling.

## 2021-11-19 DIAGNOSIS — F419 Anxiety disorder, unspecified: Secondary | ICD-10-CM | POA: Insufficient documentation

## 2021-11-19 DIAGNOSIS — D351 Benign neoplasm of parathyroid gland: Secondary | ICD-10-CM | POA: Insufficient documentation

## 2021-11-19 DIAGNOSIS — Z807 Family history of other malignant neoplasms of lymphoid, hematopoietic and related tissues: Secondary | ICD-10-CM | POA: Insufficient documentation

## 2021-11-19 DIAGNOSIS — R7303 Prediabetes: Secondary | ICD-10-CM | POA: Insufficient documentation

## 2021-12-11 DIAGNOSIS — K589 Irritable bowel syndrome without diarrhea: Secondary | ICD-10-CM | POA: Insufficient documentation

## 2022-05-25 DIAGNOSIS — G4700 Insomnia, unspecified: Secondary | ICD-10-CM | POA: Insufficient documentation

## 2022-05-25 DIAGNOSIS — E876 Hypokalemia: Secondary | ICD-10-CM | POA: Insufficient documentation

## 2022-05-29 DIAGNOSIS — I071 Rheumatic tricuspid insufficiency: Secondary | ICD-10-CM | POA: Insufficient documentation

## 2022-05-30 DIAGNOSIS — I639 Cerebral infarction, unspecified: Secondary | ICD-10-CM | POA: Insufficient documentation

## 2022-06-04 DIAGNOSIS — G43909 Migraine, unspecified, not intractable, without status migrainosus: Secondary | ICD-10-CM | POA: Insufficient documentation

## 2022-07-03 ENCOUNTER — Ambulatory Visit (INDEPENDENT_AMBULATORY_CARE_PROVIDER_SITE_OTHER): Payer: BC Managed Care – PPO

## 2022-07-03 ENCOUNTER — Ambulatory Visit
Admission: EM | Admit: 2022-07-03 | Discharge: 2022-07-03 | Disposition: A | Discharge status: Home / Self Care | Payer: BC Managed Care – PPO | Attending: Physician Assistant | Admitting: Physician Assistant

## 2022-07-03 DIAGNOSIS — W182XXA Fall in (into) shower or empty bathtub, initial encounter: Secondary | ICD-10-CM | POA: Diagnosis not present

## 2022-07-03 DIAGNOSIS — M25472 Effusion, left ankle: Secondary | ICD-10-CM | POA: Diagnosis not present

## 2022-07-03 DIAGNOSIS — M25572 Pain in left ankle and joints of left foot: Secondary | ICD-10-CM

## 2022-07-03 DIAGNOSIS — W19XXXA Unspecified fall, initial encounter: Secondary | ICD-10-CM | POA: Diagnosis not present

## 2022-07-03 NOTE — ED Provider Notes (Signed)
[facility]    CSN: [id number] Arrival date & time: 07/03/22  1347      History   Chief Complaint Chief Complaint  Patient presents with   Fall    HPI Kim Cannon is a 51 y.o. female presenting for 3-week history of left lateral ankle pain and swelling.  Patient has a couple weeks ago she fell in the shower and twisted her ankle.  She was initially seen and had a negative x-ray and advised that her ankle sprain.  She says pain had been at a steady 5-6 out of 10 over the past couple weeks until the last couple of days when it has gone up to a 10 out of 10.  She reports that she is able to bear weight but she has significant pain when trying to walk.  She also reports it is difficult to go up and down stairs she has been going up to her son's third floor apartment.  She is concerned about a missed fracture.  She has been taking ibuprofen and Tylenol as well as using heat and ice and an ankle brace.  No history of fracture or surgery of affected ankle.  No numbness or tingling.  No other complaints.  HPI  Past Medical History:  Diagnosis Date   GERD (gastroesophageal reflux disease)    H/O spinal fusion    Reactive airway disease     Patient Active Problem List   Diagnosis Date Noted   Impingement syndrome of left shoulder 06/15/2014    Past Surgical History:  Procedure Laterality Date   ABDOMINAL HYSTERECTOMY     PARATHYROIDECTOMY     SPINAL FUSION      OB History   No obstetric history on file.      Home Medications    Prior to Admission medications   Not on File    Family History Family History  Problem Relation Age of Onset   Thyroid disease Mother    Non-Hodgkin's lymphoma Father    Hypertension Brother     Social History Social History   Tobacco Use   Smoking status: Never   Smokeless tobacco: Never  Vaping Use   Vaping Use: Never used  Substance Use Topics   Alcohol use: Yes    Comment: socially   Drug use: No     Allergies    Patient has no known allergies.   Review of Systems Review of Systems  Musculoskeletal:  Positive for arthralgias, gait problem and joint swelling.  Skin:  Negative for color change and wound.  Neurological:  Negative for weakness and numbness.     Physical Exam Triage Vital Signs ED Triage Vitals  Enc Vitals Group     BP 07/03/22 1447 138/88     Pulse Rate 07/03/22 1447 70     Resp 07/03/22 1447 18     Temp 07/03/22 1447 98.1 F (36.7 C)     Temp Source 07/03/22 1447 Oral     SpO2 07/03/22 1447 94 %     Weight 07/03/22 1444 205 lb (93 kg)     Height 07/03/22 1444 5\' 5"  (1.651 m)     Head Circumference --      Peak Flow --      Pain Score 07/03/22 1443 10     Pain Loc --      Pain Edu? --      Excl. in GC? --    No data found.  Updated Vital Signs BP 138/88 (  BP Location: Left Arm)   Pulse 70   Temp 98.1 F (36.7 C) (Oral)   Resp 18   Ht 5\' 5"  (1.651 m)   Wt 205 lb (93 kg)   SpO2 94%   BMI 34.11 kg/m      Physical Exam Vitals and nursing note reviewed.  Constitutional:      General: She is not in acute distress.    Appearance: Normal appearance. She is not ill-appearing or toxic-appearing.  HENT:     Head: Normocephalic and atraumatic.  Eyes:     General: No scleral icterus.       Right eye: No discharge.        Left eye: No discharge.     Conjunctiva/sclera: Conjunctivae normal.  Cardiovascular:     Rate and Rhythm: Normal rate and regular rhythm.     Pulses: Normal pulses.  Pulmonary:     Effort: Pulmonary effort is normal. No respiratory distress.  Musculoskeletal:     Cervical back: Neck supple.     Left ankle: Swelling (moderate swelling of lateral ankle) present. No deformity or ecchymosis. Tenderness present over the lateral malleolus and ATF ligament. Decreased range of motion. Normal pulse.  Skin:    General: Skin is dry.  Neurological:     General: No focal deficit present.     Mental Status: She is alert. Mental status is at baseline.      Motor: No weakness.     Gait: Gait abnormal.  Psychiatric:        Mood and Affect: Mood normal.        Behavior: Behavior normal.        Thought Content: Thought content normal.      UC Treatments / Results  Labs (all labs ordered are listed, but only abnormal results are displayed) Labs Reviewed - No data to display  EKG   Radiology DG Ankle Complete Left  Result Date: 07/03/2022 CLINICAL DATA:  Pain for 3 weeks. Fell while in shower. Lateral ankle swelling. EXAM: LEFT ANKLE COMPLETE - 3+ VIEW COMPARISON:  None Available. FINDINGS: The ankle mortise is symmetric and intact. Small plantar calcaneal heel spur. Joint spaces are preserved. There is a well corticated ossicle just medial to the inferior aspect of the talus and adjacent to the navicular on frontal and oblique views, favored to represent a normal variant os naviculare. No acute fracture is seen. No dislocation. IMPRESSION: No acute fracture. Electronically Signed   By: 09/02/2022 M.D.   On: 07/03/2022 15:14    Procedures Procedures (including critical care time)  Medications Ordered in UC Medications - No data to display  Initial Impression / Assessment and Plan / UC Course  I have reviewed the triage vital signs and the nursing notes.  Pertinent labs & imaging results that were available during my care of the patient were reviewed by me and considered in my medical decision making (see chart for details).   51 year old female presenting for left ankle pain and swelling for the past 3 weeks.  Patient reports a fall 3 weeks ago.  Had initial x-ray that was negative.  Pain has recently worsened and she denies reinjury.  X-ray obtained today does not show any evidence of fracture.  I question if patient has a grade 3 ankle sprain or occult fracture.  I have advised her that she should follow-up with orthopedics that she likely needs more advanced imaging at this time to further assess her condition such as an MRI.  Patient given cam boot and advised of continuing to follow RICE guidelines and ibuprofen and Tylenol for pain relief.  Work note provided for today.  Patient was given information for orthopedics to contact for appointment.   Final Clinical Impressions(s) / UC Diagnoses   Final diagnoses:  Pain and swelling of left ankle  Fall, initial encounter     Discharge Instructions      PAIN: Your x-ray does not show any fractures but as we discussed, you likely have a ligament tear.  This could be a grade 3 ankle sprain or tear of other ligament.  You should follow-up with orthopedics.  Stressed avoiding painful activities . Reviewed RICE guidelines. Use medications as directed, including NSAIDs. If no NSAIDs have been prescribed for you today, you may take Aleve or Motrin over the counter. May use Tylenol in between doses of NSAIDs.    You have a condition requiring you to follow up with Orthopedics so please call one of the following office for appointment:   [facility] [address] [place], [place] [address] Phone: [phone]  [facility] [address] [place], [place] [address] Phone: [phone]      ED Prescriptions   None    PDMP not reviewed this encounter.   Shirlee Latch, PA-C 07/03/22 1538

## 2022-07-03 NOTE — Discharge Instructions (Addendum)
PAIN: Your x-ray does not show any fractures but as we discussed, you likely have a ligament tear.  This could be a grade 3 ankle sprain or tear of other ligament.  You should follow-up with orthopedics.  Stressed avoiding painful activities . Reviewed RICE guidelines. Use medications as directed, including NSAIDs. If no NSAIDs have been prescribed for you today, you may take Aleve or Motrin over the counter. May use Tylenol in between doses of NSAIDs.    You have a condition requiring you to follow up with Orthopedics so please call one of the following office for appointment:   [facility] [address] [place], [place] [address] Phone: [phone]  [facility] [address] [place], [place] [address] Phone: [phone]

## 2022-07-03 NOTE — ED Triage Notes (Signed)
Pt c/o left ankle pain x3weeks  Pt fell while in the shower. Pt states that she had it evaluated and it was xrayed and was told that it was a sprain.   Pt has swelling along the side of her ankle and the pain goes up the ankle and down the side of the foot.  Pt is worried that her foot is broken

## 2023-08-12 ENCOUNTER — Ambulatory Visit
Admission: EM | Admit: 2023-08-12 | Discharge: 2023-08-12 | Disposition: A | Discharge status: Home / Self Care | Payer: BC Managed Care – PPO | Attending: Family Medicine | Admitting: Family Medicine

## 2023-08-12 ENCOUNTER — Encounter: Payer: Self-pay | Admitting: Emergency Medicine

## 2023-08-12 DIAGNOSIS — Z9889 Other specified postprocedural states: Secondary | ICD-10-CM | POA: Diagnosis not present

## 2023-08-12 DIAGNOSIS — M47817 Spondylosis without myelopathy or radiculopathy, lumbosacral region: Secondary | ICD-10-CM

## 2023-08-12 LAB — POCT URINALYSIS DIP (MANUAL ENTRY)
Bilirubin, UA: NEGATIVE
Blood, UA: NEGATIVE
Glucose, UA: NEGATIVE mg/dL
Ketones, POC UA: NEGATIVE mg/dL
Leukocytes, UA: NEGATIVE
Nitrite, UA: NEGATIVE
Protein Ur, POC: NEGATIVE mg/dL
Spec Grav, UA: 1.025 (ref 1.010–1.025)
Urobilinogen, UA: 0.2 U/dL
pH, UA: 7 (ref 5.0–8.0)

## 2023-08-12 MED ORDER — NAPROXEN SODIUM 550 MG PO TABS
550.0000 mg | ORAL_TABLET | Freq: Two times a day (BID) | ORAL | 0 refills | Status: DC
Start: 2023-08-12 — End: 2024-08-03

## 2023-08-12 NOTE — ED Provider Notes (Signed)
[facility]    CSN: [id number] Arrival date & time: 08/12/23  1213      History   Chief Complaint Chief Complaint  Patient presents with   Abdominal Pain    HPI Kim Cannon is a 52 y.o. female.   Kim Cannon is here for pain that goes from her low back down into her right groin.  She feels like it might be her right ovary.  She states at times the pain is severe.  She states that it has been present for the last 2 nights.  She states that Monday night and Tuesday night she awakened in the middle of the night with severe pain.  It eased off during the day and then came back again.  Today she still has some minor amount of pain in her low back and right groin. Patient has had a hysterectomy for endometriosis.  She does still have her ovaries. Patient has had a colonoscopy in the past that was normal.  No diverticulosis or problems with colon Patient has an extensive history of back pain, procedures.  She has scoliosis and had her first surgery at the age of 55.  She has now had 4 back surgeries.  She is also had a nerve ablation.  Multiple epidural steroids.  Her back has not been bothering her a lot lately, however, she remains under the care of the spine and scoliosis center.  She states she had recent x-rays and has an MRI pending.  She has never had back pain with her ongoing back complaints.  No numbness or weakness.  No bowel or bladder complaints.    Past Medical History:  Diagnosis Date   GERD (gastroesophageal reflux disease)    H/O spinal fusion    Reactive airway disease     Patient Active Problem List   Diagnosis Date Noted   Impingement syndrome of left shoulder 06/15/2014    Past Surgical History:  Procedure Laterality Date   ABDOMINAL HYSTERECTOMY     PARATHYROIDECTOMY     SPINAL FUSION      OB History   No obstetric history on file.      Home Medications    Prior to Admission medications   Medication Sig Start Date End Date Taking?  Authorizing Provider  albuterol (VENTOLIN HFA) 108 (90 Base) MCG/ACT inhaler Inhale 2 puffs into the lungs every 6 (six) hours as needed for wheezing. 03/03/22  Yes [provider]  atorvastatin (LIPITOR) 10 MG tablet 10 mg by Does not apply route daily. 11/19/21  Yes [provider]  escitalopram (LEXAPRO) 10 MG tablet Take 10 mg by mouth daily. 11/14/21  Yes [provider]  naproxen sodium (ANAPROX DS) 550 MG tablet Take 1 tablet (550 mg total) by mouth 2 (two) times daily with a meal. 08/12/23  Yes Eustace Moore, MD  traZODone (DESYREL) 100 MG tablet 100 mg by Does not apply route at bedtime as needed for sleep. 11/17/22  Yes [provider]    Family History Family History  Problem Relation Age of Onset   Thyroid disease Mother    Non-Hodgkin's lymphoma Father    Hypertension Brother     Social History Social History   Tobacco Use   Smoking status: Never   Smokeless tobacco: Never  Vaping Use   Vaping status: Never Used  Substance Use Topics   Alcohol use: Yes    Comment: socially   Drug use: No     Allergies  Patient has no known allergies.   Review of Systems Review of Systems See HPI  Physical Exam Triage Vital Signs ED Triage Vitals  Encounter Vitals Group     BP 08/12/23 1229 (!) 148/84     Systolic BP Percentile --      Diastolic BP Percentile --      Pulse Rate 08/12/23 1229 76     Resp 08/12/23 1229 18     Temp 08/12/23 1229 98.3 F (36.8 C)     Temp Source 08/12/23 1229 Oral     SpO2 08/12/23 1229 94 %     Weight 08/12/23 1231 230 lb (104.3 kg)     Height 08/12/23 1231 5\' 5"  (1.651 m)     Head Circumference --      Peak Flow --      Pain Score 08/12/23 1230 3     Pain Loc --      Pain Education --      Exclude from Growth Chart --    No data found.  Updated Vital Signs BP (!) 148/84 (BP Location: Right Arm)   Pulse 76   Temp 98.3 F (36.8 C) (Oral)   Resp 18   Ht 5\' 5"  (1.651 m)   Wt 104.3 kg    SpO2 94%   BMI 38.27 kg/m       Physical Exam Constitutional:      General: She is not in acute distress.    Appearance: She is well-developed.     Comments: Overweight.  Pleasant  HENT:     Head: Normocephalic and atraumatic.  Eyes:     Conjunctiva/sclera: Conjunctivae normal.     Pupils: Pupils are equal, round, and reactive to light.  Cardiovascular:     Rate and Rhythm: Normal rate.  Pulmonary:     Effort: Pulmonary effort is normal. No respiratory distress.  Abdominal:     General: Bowel sounds are normal. There is no distension.     Palpations: Abdomen is soft.     Tenderness: There is no abdominal tenderness.     Comments: Abdomen is soft.  Bowel sounds are active.  No organomegaly.  There is no tenderness to palpation throughout the abdominal structures.  There is mild tenderness to palpation in the right inguinal region that causes pain to shoot down her anterior thigh.  Musculoskeletal:        General: Tenderness present. No deformity. Normal range of motion.     Cervical back: Normal range of motion.     Comments: Patient has a well-healed lumbar scar that extends from her shoulder blades to the top of the posterior pelvis/sacrum.  Back is straight.  There is no tenderness over the spines.  Mild tenderness over the central sacrum and left SI region.  Limited range of motion lumbar spine.  Reflexes and straight leg raise are symmetric bilaterally.  Skin:    General: Skin is warm and dry.  Neurological:     Mental Status: She is alert.      UC Treatments / Results  Labs (all labs ordered are listed, but only abnormal results are displayed) Labs Reviewed  POCT URINALYSIS DIP (MANUAL ENTRY) - Abnormal; Notable for the following components:      Result Value   Clarity, UA cloudy (*)    All other components within normal limits  CBC WITH DIFFERENTIAL/PLATELET  SEDIMENTATION RATE  C-REACTIVE PROTEIN    EKG   Radiology No results  found.  Procedures Procedures (including critical  care time)  Medications Ordered in UC Medications - No data to display  Initial Impression / Assessment and Plan / UC Course  I have reviewed the triage vital signs and the nursing notes.  Pertinent labs & imaging results that were available during my care of the patient were reviewed by me and considered in my medical decision making (see chart for details).     I do not think she has an intra-abdominal process causing her pain.  Urinalysis is normal.  Exam is unremarkable.  She does have extensive history of musculoskeletal problem.  Concern for night pain.  Will do CBC and sed rate, C-reactive protein.  Follow-up with spine doctor. Final Clinical Impressions(s) / UC Diagnoses   Final diagnoses:  Lumbosacral spondylosis without myelopathy  History of lumbar surgery     Discharge Instructions      Take naproxen 2 times a day with food Check MyChart for your test results If your pain persists, follow-up with your spine doctors A nurse will call you if any of your test results are abnormal   ED Prescriptions     Medication Sig Dispense Auth. Provider   naproxen sodium (ANAPROX DS) 550 MG tablet Take 1 tablet (550 mg total) by mouth 2 (two) times daily with a meal. 30 tablet Eustace Moore, MD      PDMP not reviewed this encounter.   Eustace Moore, MD 08/12/23 [phone]

## 2023-08-12 NOTE — ED Triage Notes (Signed)
Patient c/o right lower back pain that radiates around to right lower abdomen area, more "ovary area" x 2 nights.  Some nausea and diarrhea, no vomiting.  Denies any hematuria, some urinary frequency and urgency.  Patient has taken Ibuprofen and hx of kidney stone in 2005.

## 2023-08-12 NOTE — Discharge Instructions (Signed)
Take naproxen 2 times a day with food Check MyChart for your test results If your pain persists, follow-up with your spine doctors A nurse will call you if any of your test results are abnormal

## 2023-08-13 LAB — C-REACTIVE PROTEIN: CRP: 2 mg/L (ref 0–10)

## 2023-08-13 LAB — CBC WITH DIFFERENTIAL/PLATELET
Basophils Absolute: 0 10*3/uL (ref 0.0–0.2)
Basos: 0 %
EOS (ABSOLUTE): 0.1 10*3/uL (ref 0.0–0.4)
Eos: 1 %
Hematocrit: 40.1 % (ref 34.0–46.6)
Hemoglobin: 12.6 g/dL (ref 11.1–15.9)
Immature Grans (Abs): 0 10*3/uL (ref 0.0–0.1)
Immature Granulocytes: 0 %
Lymphocytes Absolute: 1.5 10*3/uL (ref 0.7–3.1)
Lymphs: 24 %
MCH: 23.2 pg — ABNORMAL LOW (ref 26.6–33.0)
MCHC: 31.4 g/dL — ABNORMAL LOW (ref 31.5–35.7)
MCV: 74 fL — ABNORMAL LOW (ref 79–97)
Monocytes Absolute: 0.5 10*3/uL (ref 0.1–0.9)
Monocytes: 8 %
Neutrophils Absolute: 4 10*3/uL (ref 1.4–7.0)
Neutrophils: 67 %
Platelets: 256 10*3/uL (ref 150–450)
RBC: 5.43 x10E6/uL — ABNORMAL HIGH (ref 3.77–5.28)
RDW: 15 % (ref 11.7–15.4)
WBC: 6 10*3/uL (ref 3.4–10.8)

## 2023-08-13 LAB — SEDIMENTATION RATE: Sed Rate: 20 mm/h (ref 0–40)

## 2024-06-03 DIAGNOSIS — F32A Depression, unspecified: Secondary | ICD-10-CM | POA: Insufficient documentation

## 2024-07-13 DIAGNOSIS — R718 Other abnormality of red blood cells: Secondary | ICD-10-CM | POA: Insufficient documentation

## 2024-07-13 DIAGNOSIS — J4520 Mild intermittent asthma, uncomplicated: Secondary | ICD-10-CM | POA: Insufficient documentation

## 2024-07-13 DIAGNOSIS — E7800 Pure hypercholesterolemia, unspecified: Secondary | ICD-10-CM | POA: Insufficient documentation

## 2024-08-02 ENCOUNTER — Ambulatory Visit (INDEPENDENT_AMBULATORY_CARE_PROVIDER_SITE_OTHER): Admitting: Bariatrics

## 2024-08-02 ENCOUNTER — Encounter: Payer: Self-pay | Admitting: Bariatrics

## 2024-08-02 VITALS — BP 132/87 | HR 84 | Temp 97.9°F | Ht 65.0 in | Wt 241.0 lb

## 2024-08-02 DIAGNOSIS — R7303 Prediabetes: Secondary | ICD-10-CM | POA: Diagnosis not present

## 2024-08-02 DIAGNOSIS — E66813 Obesity, class 3: Secondary | ICD-10-CM

## 2024-08-02 DIAGNOSIS — Z6841 Body Mass Index (BMI) 40.0 and over, adult: Secondary | ICD-10-CM | POA: Diagnosis not present

## 2024-08-02 DIAGNOSIS — E65 Localized adiposity: Secondary | ICD-10-CM

## 2024-08-02 NOTE — Progress Notes (Signed)
 Office: [phone]  /  Fax: [phone]   Initial Visit  Kim Cannon was seen in clinic today to evaluate for obesity. She is interested in losing weight to improve overall health and reduce the risk of weight related complications. She presents today to review program treatment options, initial physical assessment, and evaluation.     She was referred by: Self-Referral  When asked what else they would like to accomplish? She states: Adopt a healthier eating pattern and lifestyle, Improve energy levels and physical activity, Improve existing medical conditions, and Improve quality of life  When asked how has your weight affected you? She states: Contributed to medical problems, Having fatigue, and Having poor endurance  Some associated conditions: Hyperlipidemia, Prediabetes, and Vitamin D Deficiency  Contributing factors: menopause and sedentary job  Weight promoting medications identified: None  Current nutrition plan: None, Portion control / smart choices, and Other: Vegan  Current level of physical activity: has tried some walking.   Current or previous pharmacotherapy: Other: Saxenda  Response to medication: Was cost prohibitive or lost coverage for AOM   Past medical history includes:   Past Medical History:  Diagnosis Date   GERD (gastroesophageal reflux disease)    H/O spinal fusion    Reactive airway disease      Objective:   BP 132/87   Pulse 84   Temp 97.9 F (36.6 C)   Ht 5' 5 (1.651 m)   Wt 241 lb (109.3 kg)   SpO2 99%   BMI 40.10 kg/m  She was weighed on the bioimpedance scale: Body mass index is 40.1 kg/m.  Peak Weight:241 lbs , Body Fat%:45.2 %, Visceral Fat Rating:14, Weight trend over the last 12 months: Increasing  General:  Alert, oriented and cooperative. Patient is in no acute distress.  Respiratory: Normal respiratory effort, no problems with respiration noted  Extremities: Normal range of motion.    Mental Status: Normal mood and  affect. Normal behavior. Normal judgment and thought content.   DIAGNOSTIC DATA REVIEWED:  BMET No results found for: NA, K, CL, CO2, GLUCOSE, BUN, CREATININE, CALCIUM, GFRNONAA, GFRAA No results found for: HGBA1C No results found for: INSULIN CBC    Component Value Date/Time   WBC 6.0 08/12/2023 1315   RBC 5.43 (H) 08/12/2023 1315   HGB 12.6 08/12/2023 1315   HCT 40.1 08/12/2023 1315   PLT 256 08/12/2023 1315   MCV 74 (L) 08/12/2023 1315   MCH 23.2 (L) 08/12/2023 1315   MCHC 31.4 (L) 08/12/2023 1315   RDW 15.0 08/12/2023 1315   Iron/TIBC/Ferritin/ %Sat No results found for: IRON, TIBC, FERRITIN, IRONPCTSAT Lipid Panel  No results found for: CHOL, TRIG, HDL, CHOLHDL, VLDL, LDLCALC, LDLDIRECT Hepatic Function Panel  No results found for: PROT, ALBUMIN, AST, ALT, ALKPHOS, BILITOT, BILIDIR, IBILI No results found for: TSH   Assessment and Plan:   Visceral Obesity.   She has a visceral fat rating of 14 per the bio-impedence scale.   Plan: The goal is a visceral fat rating of 13 or below.  Will work on the plan and increase exercise/begin exercise.  Will minimize all carbohydrates ( sweets and starches ).     Prediabetes Last A1c was in the prediabetic range   Medication(s): none   Plan: Will begin the plan and exercise.     Morbid Obesity: Current BMI 40.10    Obesity Treatment / Action Plan:  Patient will work on garnering support from family and friends to begin weight loss journey. Will work  on eliminating or reducing the presence of highly palatable, calorie dense foods in the home. Will complete provided nutritional and psychosocial assessment questionnaire before the next appointment. Will be scheduled for indirect calorimetry to determine resting energy expenditure in a fasting state.  This will allow us  to create a reduced calorie, high-protein meal plan to promote loss of fat mass while  preserving muscle mass. Counseled on the health benefits of losing 5%-15% of total body weight. Was counseled on nutritional approaches to weight loss and benefits of reducing processed foods and consuming plant-based foods and high quality protein as part of nutritional weight management. Was counseled on pharmacotherapy and role as an adjunct in weight management.   Obesity Education Performed Today:  She was weighed on the bioimpedance scale and results were discussed and documented in the synopsis.  We discussed obesity as a disease and the importance of a more detailed evaluation of all the factors contributing to the disease.  We discussed the importance of long term lifestyle changes which include nutrition, exercise and behavioral modifications as well as the importance of customizing this to her specific health and social needs.  We discussed the benefits of reaching a healthier weight to alleviate the symptoms of existing conditions and reduce the risks of the biomechanical, metabolic and psychological effects of obesity.  Discussed New Patient/Late Arrival, and Cancellation Policies. Patient voiced understanding and allowed to ask questions.   Kim Cannon appears to be in the action stage of change and states they are ready to start intensive lifestyle modifications and behavioral modifications.  30 minutes was spent today on this visit including the above counseling, pre-visit chart review, and post-visit documentation.  Reviewed by clinician on day of visit: allergies, medications, problem list, medical history, surgical history, family history, social history, and previous encounter notes.    Baxter Gonzalez A. Delores CORDOBAO.

## 2024-08-03 ENCOUNTER — Encounter: Payer: Self-pay | Admitting: Bariatrics

## 2024-08-03 ENCOUNTER — Ambulatory Visit: Admitting: Bariatrics

## 2024-08-03 VITALS — BP 130/83 | HR 74 | Temp 98.0°F | Ht 65.0 in | Wt 243.0 lb

## 2024-08-03 DIAGNOSIS — R0602 Shortness of breath: Secondary | ICD-10-CM

## 2024-08-03 DIAGNOSIS — Z6841 Body Mass Index (BMI) 40.0 and over, adult: Secondary | ICD-10-CM | POA: Diagnosis not present

## 2024-08-03 DIAGNOSIS — E66813 Obesity, class 3: Secondary | ICD-10-CM

## 2024-08-03 DIAGNOSIS — E559 Vitamin D deficiency, unspecified: Secondary | ICD-10-CM | POA: Diagnosis not present

## 2024-08-03 DIAGNOSIS — R5383 Other fatigue: Secondary | ICD-10-CM

## 2024-08-03 DIAGNOSIS — Z0000 Encounter for general adult medical examination without abnormal findings: Secondary | ICD-10-CM

## 2024-08-03 DIAGNOSIS — R7303 Prediabetes: Secondary | ICD-10-CM | POA: Diagnosis not present

## 2024-08-03 DIAGNOSIS — Z1331 Encounter for screening for depression: Secondary | ICD-10-CM

## 2024-08-03 NOTE — Progress Notes (Signed)
 At a Glance:  Vitals Temp: 98 F (36.7 C) BP: 130/83 Pulse Rate: 74 SpO2: 98 %   Anthropometric Measurements Height: 5' 5 (1.651 m) Weight: 243 lb (110.2 kg) BMI (Calculated): 40.44 Starting Weight: 243lb Peak Weight: 241lb Waist Measurement : 48 inches   Body Composition  Body Fat %: 48.9 % Fat Mass (lbs): 119 lbs Muscle Mass (lbs): 118 lbs Total Body Water (lbs): 88.6 lbs Visceral Fat Rating : 15   Other Clinical Data RMR: 1656 Fasting: yes Labs: yes Today's Visit #: 1 Starting Date: 08/03/24    EKG: Normal sinus rhythm, rate 74.   Indirect Calorimeter:   Resting Metabolic Rate ( RMR):  RMR (actual): 1656 kcal  RMR (calculated): 1768 kcal The calculated basal metabolic rate is 8231 kcal  thus her basal metabolic rate is worse than expected.  Plan:   Indirect calorimeter completed, interpreted and reviewed with patient today and allowed to ask questions.  Discussed the implications for the chosen plan and exercise based on the RMR reading.  Will consider repeating the RMR in the future based on weight loss.    Chief Complaint:  Obesity   Subjective:  Kim Cannon (MR# [id number]) is a 53 y.o. female who presents for evaluation and treatment of obesity and related comorbidities.   Aston is currently in the action stage of change and ready to dedicate time achieving and maintaining a healthier weight. Mechille is interested in becoming our patient and working on intensive lifestyle modifications including (but not limited to) diet and exercise for weight loss.  Aileana has been struggling with her weight. She has been unsuccessful in either losing weight, maintaining weight loss, or reaching her healthy weight goal.  Breonna's habits were reviewed today and are as follows: she snacks frequently in the evenings and she skips meals frequently.  Current or previous pharmacotherapy: Saxenda  Response to medication: Was cost prohibitive or lost coverage  for AOM  Other Fatigue Rubina admits to daytime somnolence and admits to waking up still tired. Shahad generally gets 5 or 6 hours of sleep per night, and states that she has difficulty falling back asleep if awakened. Snoring is present. Apneic episodes is present. Epworth Sleepiness Score is 9.   Shortness of Breath Adelfa notes increasing shortness of breath with exercising and seems to be worsening over time with weight gain. She notes getting out of breath sooner with activity than she used to. This has not gotten worse recently. Emeri denies shortness of breath at rest or orthopnea.  Depression Screen Myrth's Food and Mood (modified PHQ-9) score was 13. 10-14 moderate depression     08/03/2024    9:39 AM  Depression screen PHQ 2/9  Decreased Interest 1  Down, Depressed, Hopeless 0  PHQ - 2 Score 1  Altered sleeping 1  Tired, decreased energy 1  Change in appetite 0  Feeling bad or failure about yourself  1  Trouble concentrating 0  Moving slowly or fidgety/restless 0  Suicidal thoughts 0  PHQ-9 Score 4     Assessment and Plan:   Other Fatigue Alizaya does feel that her weight is causing her energy to be lower than it should be. Fatigue may be related to obesity, depression or many other causes. Labs will be ordered, and in the meanwhile, Jacoya will focus on self care including making healthy food choices, increasing physical activity and focusing on stress reduction.  Shortness of Breath Brittley does not feel that she gets out of breath more  easily that she used to when she exercises. Maisha's shortness of breath appears to be obesity related and exercise induced. She has agreed to work on weight loss and gradually increase exercise to treat her exercise induced shortness of breath. Will continue to monitor closely.  Health Maintenance:   Obesity   Plan: Will do EKG, indirect calorimetry, and labs.     Vitamin D Deficiency She is at risk for vitamin D deficiency due to  obesity.  She is on  prescription ergocalciferol 50,000 IU weekly.  Plan: Continue  prescription vitamin D 50,000 IU weekly.    Aireonna had a positive depression screening. Depression is commonly associated with obesity and often results in emotional eating behaviors. We will monitor this closely and work on CBT to help improve the non-hunger eating patterns. Referral to Psychology may be required if no improvement is seen as she continues in our clinic.   Prediabetes Last A1c was 5.9 on 07/23/23.  Medication(s): none  Plan: Will minimize all refined carbohydrates both sweets and starches.  Will work on the plan and exercise.  Consider both aerobic and resistance training.  Will keep protein, water, and fiber intake high.  Increase Polyunsaturated and Monounsaturated fats to increase satiety and encourage weight loss.  Aim for 7 to 9 hours of sleep nightly.   Previous labs reviewed today. Date: 08/11/24.  CBC, sed rate, CRP  Labs done today Insulin, Vit B12, and TSH   Morbid Obesity: BMI (Calculated): 40.44   Carel is currently in the action stage of change and her goal is to begin weight loss efforts. I recommend Valeree begin the structured treatment plan as follows:  She has agreed to Category 2 Plan  Exercise goals: All adults should avoid inactivity. Some activity is better than none, and adults who participate in any amount of physical activity, gain some health benefits.  Behavioral modification strategies:increasing lean protein intake, decreasing simple carbohydrates, increasing vegetables, increase H2O intake, increase high fiber foods, no skipping meals, meal planning and cooking strategies, keeping healthy foods in the home, better snacking choices, avoiding temptations, and planning for success  She was informed of the importance of frequent follow-up visits to maximize her success with intensive lifestyle modifications for her multiple health conditions. She was  informed we would discuss her lab results at her next visit unless there is a critical issue that needs to be addressed sooner. Guneet agreed to keep her next visit at the agreed upon time to discuss these results.  Objective:  General: Cooperative, alert, well developed, in no acute distress. HEENT: Conjunctivae and lids unremarkable. Cardiovascular: Regular rhythm.  Lungs: Normal work of breathing. Neurologic: No focal deficits.   No results found for: CREATININE, BUN, NA, K, CL, CO2 No results found for: ALT, AST, GGT, ALKPHOS, BILITOT No results found for: HGBA1C No results found for: INSULIN No results found for: TSH No results found for: CHOL, HDL, LDLCALC, LDLDIRECT, TRIG, CHOLHDL Lab Results  Component Value Date   WBC 6.0 08/12/2023   HGB 12.6 08/12/2023   HCT 40.1 08/12/2023   MCV 74 (L) 08/12/2023   PLT 256 08/12/2023   No results found for: IRON, TIBC, FERRITIN  Attestation Statements:  Applicable history such as the following:  allergies, medications, problem list, medical history, surgical history, family history, social history, and previous encounter notes reviewed by clinician on day of visit:  Time spent on visit in care of the patient today including the items listed below was 40 minutes.  20 minutes were spent talking about the history, 20 minutes for face to face counseling implementing the plan, discussing the specifics of how to arrange meals, meal planning, water intake.   I spent face to face time discussing his/her plan, including breakfast, additional breakfast options, lunch, and dinner options, grocery list, and snacks.  I reviewed her indirect calorimetry. I discussed the implications for the diet plan.    Discussed the bio-impedence test (fat %, muscle mass, and water weight) and allowed the patient to ask questions.   Discussed the following information sheets: Category 2, Grocery List, 100 Calorie  Snacks, 200 Calorie Snacks, and protein shake sheet. .   I reviewed the labs which were ordered from her visit on 07/22/24.    I additionally spent time documenting, reviewing, and checking the codes before submitting.   This may have been prepared with the assistance of Engineer, civil (consulting).  Occasional wrong-word or sound-a-like substitutions may have occurred due to the inherent limitations of voice recognition software.    Clayborne Daring, DO

## 2024-08-04 ENCOUNTER — Encounter: Payer: Self-pay | Admitting: Bariatrics

## 2024-08-04 DIAGNOSIS — E88819 Insulin resistance, unspecified: Secondary | ICD-10-CM | POA: Insufficient documentation

## 2024-08-04 LAB — INSULIN, RANDOM: INSULIN: 26.8 u[IU]/mL — ABNORMAL HIGH (ref 2.6–24.9)

## 2024-08-04 LAB — VITAMIN B12: Vitamin B-12: 899 pg/mL (ref 232–1245)

## 2024-08-04 LAB — TSH: TSH: 1.37 u[IU]/mL (ref 0.450–4.500)

## 2024-08-22 ENCOUNTER — Ambulatory Visit: Admitting: Bariatrics

## 2024-09-28 ENCOUNTER — Ambulatory Visit (INDEPENDENT_AMBULATORY_CARE_PROVIDER_SITE_OTHER)

## 2024-09-28 ENCOUNTER — Ambulatory Visit: Admission: EM | Admit: 2024-09-28 | Discharge: 2024-09-28 | Disposition: A | Discharge status: Home / Self Care

## 2024-09-28 DIAGNOSIS — S5002XA Contusion of left elbow, initial encounter: Secondary | ICD-10-CM

## 2024-09-28 DIAGNOSIS — M25522 Pain in left elbow: Secondary | ICD-10-CM

## 2024-09-28 NOTE — ED Triage Notes (Addendum)
 Patient states that she was walking up some steps yesterday evening and tripped and fell injuring left arm. Left side of neck and side is sore  Took IBU yesterday

## 2024-09-28 NOTE — ED Provider Notes (Addendum)
 [facility]    CSN: [id number] Arrival date & time: 09/28/24  1244      History   Chief Complaint Chief Complaint  Patient presents with   Arm Injury    HPI Kim Cannon is a 53 y.o. female.   HPI  53 year old female with past medical history significant for left shoulder impingement syndrome, unspecified cerebral infarction, chronic midline thoracic back pain, anxiety, depression, IBS, migraine headaches, high cholesterol, mild intermittent asthma, and prediabetes presents for evaluation of pain in her left elbow after suffering a ground-level fall yesterday.  She denies any numbness or tingling in her hand.  No bruising.  Past Medical History:  Diagnosis Date   Anemia    Anxiety    Asthma    Back pain    GERD (gastroesophageal reflux disease)    H/O spinal fusion    Joint pain    Palpitations    Pre-diabetes    Reactive airway disease    Vitamin D deficiency     Patient Active Problem List   Diagnosis Date Noted   Insulin  resistance 08/04/2024   Microcytosis 07/13/2024   Mild intermittent asthma without complication 07/13/2024   Pure hypercholesterolemia 07/13/2024   Depression, unspecified 06/03/2024   Migraine, unspecified, not intractable, without status migrainosus 06/04/2022   Cerebral infarction, unspecified (HCC) 05/30/2022   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   Hypokalemia 05/25/2022   Insomnia, unspecified 05/25/2022   Irritable bowel syndrome without diarrhea 12/11/2021   Anxiety 11/19/2021   Family history of non-Hodgkin's lymphoma 11/19/2021   Parathyroid adenoma 11/19/2021   Prediabetes 11/19/2021   Lumbar spondylosis 12/09/2018   Chronic midline thoracic back pain 10/28/2018   Impingement syndrome of left shoulder 06/15/2014   Idiopathic scoliosis and kyphoscoliosis 03/01/2013   Low back pain 03/01/2013   Ovarian cyst 03/01/2012    Past Surgical History:  Procedure Laterality Date   ABDOMINAL HYSTERECTOMY      PARATHYROIDECTOMY     SPINAL FUSION      OB History     Gravida  5   Para  4   Term  4   Preterm      AB  1   Living         SAB  1   IAB      Ectopic      Multiple      Live Births               Home Medications    Prior to Admission medications   Medication Sig Start Date End Date Taking? Authorizing Provider  hydrochlorothiazide (HYDRODIURIL) 12.5 MG tablet Take 12.5 mg by mouth daily. 08/10/24 11/08/24 Yes [provider]  albuterol (VENTOLIN HFA) 108 (90 Base) MCG/ACT inhaler Inhale 2 puffs into the lungs every 6 (six) hours as needed for wheezing. Patient not taking: Reported on 08/03/2024 03/03/22   [provider]  Cholecalciferol (VITAMIN D3) 1.25 MG (50000 UT) CAPS Take 1 capsule by mouth daily.    [provider]  traZODone (DESYREL) 100 MG tablet 100 mg by Does not apply route at bedtime as needed for sleep. 11/17/22   [provider]    Family History Family History  Problem Relation Age of Onset   Thyroid disease Mother    Sleep apnea Mother    Non-Hodgkin's lymphoma Father    Hypertension Brother     Social History Social History   Tobacco Use   Smoking status: Never   Smokeless tobacco: Never  Vaping Use   Vaping status: Never Used  Substance Use Topics   Alcohol use: Yes    Comment: socially   Drug use: No     Allergies   Patient has no known allergies.   Review of Systems Review of Systems  Constitutional:  Negative for fever.  Musculoskeletal:  Positive for arthralgias. Negative for joint swelling.  Skin:  Negative for color change.  Neurological:  Negative for weakness and numbness.     Physical Exam Triage Vital Signs ED Triage Vitals  Encounter Vitals Group     BP 09/28/24 1256 (!) 156/95     Girls Systolic BP Percentile --      Girls Diastolic BP Percentile --      Boys Systolic BP Percentile --      Boys Diastolic BP Percentile --      Pulse Rate 09/28/24 1256 73      Resp 09/28/24 1256 18     Temp 09/28/24 1256 98.6 F (37 C)     Temp Source 09/28/24 1256 Oral     SpO2 09/28/24 1256 95 %     Weight 09/28/24 1255 240 lb (108.9 kg)     Height --      Head Circumference --      Peak Flow --      Pain Score 09/28/24 1255 6     Pain Loc --      Pain Education --      Exclude from Growth Chart --    No data found.  Updated Vital Signs BP (!) 156/95 (BP Location: Right Arm)   Pulse 73   Temp 98.6 F (37 C) (Oral)   Resp 18   Wt 240 lb (108.9 kg)   SpO2 95%   BMI 39.94 kg/m   Visual Acuity Right Eye Distance:   Left Eye Distance:   Bilateral Distance:    Right Eye Near:   Left Eye Near:    Bilateral Near:     Physical Exam Vitals and nursing note reviewed.  Constitutional:      Appearance: Normal appearance. She is not ill-appearing.  HENT:     Head: Normocephalic and atraumatic.  Musculoskeletal:        General: Swelling, tenderness and signs of injury present. No deformity.  Skin:    General: Skin is warm and dry.     Capillary Refill: Capillary refill takes less than 2 seconds.     Findings: No bruising or erythema.  Neurological:     General: No focal deficit present.     Mental Status: She is alert and oriented to person, place, and time.      UC Treatments / Results  Labs (all labs ordered are listed, but only abnormal results are displayed) Labs Reviewed - No data to display  EKG   Radiology No results found.  Procedures Procedures (including critical care time)  Medications Ordered in UC Medications - No data to display  Initial Impression / Assessment and Plan / UC Course  I have reviewed the triage vital signs and the nursing notes.  Pertinent labs & imaging results that were available during my care of the patient were reviewed by me and considered in my medical decision making (see chart for details).   Patient is a very pleasant, nontoxic-appearing 53 year old female presenting for evaluation of  pain in her left elbow.  She reports that she was attempting to walk up her porch steps and she slipped falling forward and landing  on her left rib cage with her left arm underneath her.  She reports that she had significant pain in her left arm yesterday but that pain has improved.  She now describes a tightness on the medial aspect of her left forearm and left elbow.  She also reports difficulty pronating and supinating her left wrist.  No numbness or tingling in her fingers.  On exam, her left forearm and elbow are normal anatomical alignment.  Sensation is intact in her fingers and her grip strength is 5/5.  Radial ulnar pulses are 2+.  No pain with palpation of the radius or ulna.  Patient does have point tenderness with palpation of the medial epicondyle of her humerus.  Her lateral epicondyle is unremarkable.  There is no overlying ecchymosis or erythema noted.  No appreciable edema.  She is able to actively pronate and supinate her wrist in the exam room.  I suspect this is most likely soft tissue in nature.  However, I will obtain a radiograph of her left elbow to evaluate for any bony injury.  Left elbow x-ray independent reviewed and evaluated by me.  Impression: No evidence of fracture or dislocation.  No anterior fat pad noted.  Mild soft tissue swelling present on the medial aspect of the forearm.  Radiology overread is pending. Radiology impression states negative exam.  I will discharge patient on the diagnosis of left elbow contusion.  I will have her use over-the-counter Tylenol  and/or ibuprofen as needed for any pain and swelling.  She may also apply ice to her forearm for 20 minutes at a time, 2-3 times a day as needed for pain and swelling.  If her symptoms do not improve I recommend she follow-up with orthopedics such as [facility] here in [place] or in [place].   Final Clinical Impressions(s) / UC Diagnoses   Final diagnoses:  Left elbow pain  Contusion of left elbow, initial  encounter     Discharge Instructions      Your x-rays do not demonstrate any broken or dislocated bones.  You do have some mild soft tissue swelling present on the inside of your left elbow.  I suspect that you have bruised your elbow as a result of your recent fall.  You may apply ice to the area for 20 minutes at a time, 2-3 times a day, to help with pain and inflammation.  Make sure you have a cloth between the ice and your skin as to not cause skin damage.  You may use over-the-counter Tylenol  and/or ibuprofen according to the package instructions as needed for pain and inflammation.  If you do not have any improvement of your pain in 7 to 10 days, or your pain worsens, I would recommend following up with orthopedics such as [facility] here in [place] or in [place].     ED Prescriptions   None    PDMP not reviewed this encounter.   Bernardino Ditch, NP 09/28/24 1330    Bernardino Ditch, NP 09/28/24 1435

## 2024-09-28 NOTE — Discharge Instructions (Addendum)
 Your x-rays do not demonstrate any broken or dislocated bones.  You do have some mild soft tissue swelling present on the inside of your left elbow.  I suspect that you have bruised your elbow as a result of your recent fall.  You may apply ice to the area for 20 minutes at a time, 2-3 times a day, to help with pain and inflammation.  Make sure you have a cloth between the ice and your skin as to not cause skin damage.  You may use over-the-counter Tylenol  and/or ibuprofen according to the package instructions as needed for pain and inflammation.  If you do not have any improvement of your pain in 7 to 10 days, or your pain worsens, I would recommend following up with orthopedics such as [facility] here in [place] or in [place].
